# Patient Record
Sex: Female | Born: 1965
Health system: Southern US, Community
[De-identification: ages and names within clinical notes are randomized; demographics above are authoritative.]

## PROBLEM LIST (undated history)

## (undated) DIAGNOSIS — E119 Type 2 diabetes mellitus without complications: Secondary | ICD-10-CM

---

## 2012-09-20 HISTORY — PX: ESSURE TUBAL LIGATION: SUR464

## 2015-07-26 ENCOUNTER — Other Ambulatory Visit: Payer: Self-pay

## 2015-07-26 DIAGNOSIS — Z1231 Encounter for screening mammogram for malignant neoplasm of breast: Secondary | ICD-10-CM

## 2016-11-26 DIAGNOSIS — Z136 Encounter for screening for cardiovascular disorders: Secondary | ICD-10-CM | POA: Diagnosis not present

## 2016-11-26 DIAGNOSIS — Z0000 Encounter for general adult medical examination without abnormal findings: Secondary | ICD-10-CM | POA: Diagnosis not present

## 2016-11-26 DIAGNOSIS — Z01118 Encounter for examination of ears and hearing with other abnormal findings: Secondary | ICD-10-CM | POA: Diagnosis not present

## 2016-11-26 DIAGNOSIS — Z113 Encounter for screening for infections with a predominantly sexual mode of transmission: Secondary | ICD-10-CM | POA: Diagnosis not present

## 2016-11-26 DIAGNOSIS — Z131 Encounter for screening for diabetes mellitus: Secondary | ICD-10-CM | POA: Diagnosis not present

## 2017-03-02 ENCOUNTER — Ambulatory Visit
Admission: RE | Admit: 2017-03-02 | Discharge: 2017-03-02 | Disposition: A | Discharge status: Home / Self Care | Payer: 59 | Source: Ambulatory Visit | Attending: Physician Assistant | Admitting: Physician Assistant

## 2017-03-02 ENCOUNTER — Other Ambulatory Visit: Payer: Self-pay | Admitting: Physician Assistant

## 2017-03-02 DIAGNOSIS — R102 Pelvic and perineal pain: Secondary | ICD-10-CM

## 2017-03-02 DIAGNOSIS — E785 Hyperlipidemia, unspecified: Secondary | ICD-10-CM | POA: Diagnosis not present

## 2017-03-02 DIAGNOSIS — R7303 Prediabetes: Secondary | ICD-10-CM | POA: Diagnosis not present

## 2017-03-02 DIAGNOSIS — Z113 Encounter for screening for infections with a predominantly sexual mode of transmission: Secondary | ICD-10-CM | POA: Diagnosis not present

## 2017-03-02 DIAGNOSIS — Z124 Encounter for screening for malignant neoplasm of cervix: Secondary | ICD-10-CM | POA: Diagnosis not present

## 2017-03-02 DIAGNOSIS — E663 Overweight: Secondary | ICD-10-CM | POA: Diagnosis not present

## 2017-03-02 DIAGNOSIS — E559 Vitamin D deficiency, unspecified: Secondary | ICD-10-CM | POA: Diagnosis not present

## 2017-03-02 DIAGNOSIS — N760 Acute vaginitis: Secondary | ICD-10-CM | POA: Diagnosis not present

## 2017-03-03 ENCOUNTER — Other Ambulatory Visit: Payer: Self-pay | Admitting: Physician Assistant

## 2017-03-03 DIAGNOSIS — R102 Pelvic and perineal pain: Secondary | ICD-10-CM

## 2017-03-16 DIAGNOSIS — R102 Pelvic and perineal pain: Secondary | ICD-10-CM | POA: Diagnosis not present

## 2017-03-16 DIAGNOSIS — N760 Acute vaginitis: Secondary | ICD-10-CM | POA: Diagnosis not present

## 2017-03-16 DIAGNOSIS — E559 Vitamin D deficiency, unspecified: Secondary | ICD-10-CM | POA: Diagnosis not present

## 2017-03-16 DIAGNOSIS — E785 Hyperlipidemia, unspecified: Secondary | ICD-10-CM | POA: Diagnosis not present

## 2017-03-16 DIAGNOSIS — B009 Herpesviral infection, unspecified: Secondary | ICD-10-CM | POA: Diagnosis not present

## 2017-03-16 DIAGNOSIS — R7303 Prediabetes: Secondary | ICD-10-CM | POA: Diagnosis not present

## 2017-03-16 DIAGNOSIS — E663 Overweight: Secondary | ICD-10-CM | POA: Diagnosis not present

## 2017-03-28 ENCOUNTER — Other Ambulatory Visit: Payer: Self-pay | Admitting: Internal Medicine

## 2017-03-28 DIAGNOSIS — Z1231 Encounter for screening mammogram for malignant neoplasm of breast: Secondary | ICD-10-CM

## 2017-03-30 ENCOUNTER — Ambulatory Visit
Admission: RE | Admit: 2017-03-30 | Discharge: 2017-03-30 | Disposition: A | Discharge status: Home / Self Care | Payer: 59 | Source: Ambulatory Visit | Attending: Internal Medicine | Admitting: Internal Medicine

## 2017-03-30 ENCOUNTER — Encounter: Payer: Self-pay | Admitting: Radiology

## 2017-03-30 DIAGNOSIS — Z1231 Encounter for screening mammogram for malignant neoplasm of breast: Secondary | ICD-10-CM

## 2017-04-12 ENCOUNTER — Encounter (HOSPITAL_COMMUNITY): Payer: Self-pay | Admitting: Emergency Medicine

## 2017-04-12 ENCOUNTER — Emergency Department (HOSPITAL_COMMUNITY)
Admission: EM | Admit: 2017-04-12 | Discharge: 2017-04-12 | Disposition: A | Discharge status: Home / Self Care | Payer: PRIVATE HEALTH INSURANCE | Attending: Emergency Medicine | Admitting: Emergency Medicine

## 2017-04-12 ENCOUNTER — Emergency Department (HOSPITAL_COMMUNITY): Payer: PRIVATE HEALTH INSURANCE

## 2017-04-12 DIAGNOSIS — Y9289 Other specified places as the place of occurrence of the external cause: Secondary | ICD-10-CM | POA: Diagnosis not present

## 2017-04-12 DIAGNOSIS — S6992XA Unspecified injury of left wrist, hand and finger(s), initial encounter: Secondary | ICD-10-CM | POA: Insufficient documentation

## 2017-04-12 DIAGNOSIS — Y990 Civilian activity done for income or pay: Secondary | ICD-10-CM | POA: Diagnosis not present

## 2017-04-12 DIAGNOSIS — Y9389 Activity, other specified: Secondary | ICD-10-CM | POA: Insufficient documentation

## 2017-04-12 DIAGNOSIS — W230XXA Caught, crushed, jammed, or pinched between moving objects, initial encounter: Secondary | ICD-10-CM | POA: Insufficient documentation

## 2017-04-12 NOTE — ED Provider Notes (Signed)
[place] DEPT Provider Note   CSN: [id number] Arrival date & time: 04/12/17  1419     History   Chief Complaint Chief Complaint  Patient presents with  . Finger Injury    HPI Brianna Lowery is a 51 y.o. female who presents to emergency department after slamming her left hand into a cabinet while at work. 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 She complains of pain mostly of the left ring finger, stating is more painful when she moves the affected finger. She describes the pain as a throbbing sensation, rated 7/10. She has not taken anything for this and is icing the affected area currently. She stats she has mild relief of the pain with elevation of the hand. She denies weakness, numbness, tingling, swelling. She has removed her ring finger from the affected hand she says incase swelling is to occur.   HPI  History reviewed. No pertinent past medical history.  There are no active problems to display for this patient.   History reviewed. No pertinent surgical history.  OB History    No data available       Home Medications    Prior to Admission medications   Not on File    Family History No family history on file.  Social History Social History  Substance Use Topics  . Smoking status: Never Smoker  . Smokeless tobacco: Never Used  . Alcohol use No     Allergies   Patient has no known allergies.   Review of Systems Review of Systems  Musculoskeletal: Positive for arthralgias.  Skin: Negative for wound.  Neurological: Negative for weakness and numbness.     Physical Exam Updated Vital Signs BP (!) 144/94 (BP Location: Left Arm)   Pulse 94   Temp 99 F (37.2 C) (Oral)   Resp 16   Ht 5\' 2"  (1.575 m)   Wt 70.8 kg (156 lb)   LMP 03/21/2017   SpO2 96%   BMI 28.53 kg/m   Physical Exam  Constitutional: She appears well-developed and well-nourished.  HENT:  Head: Normocephalic and atraumatic.  Right Ear:  External ear normal.  Left Ear: External ear normal.  Eyes: Conjunctivae are normal. Right eye exhibits no discharge. Left eye exhibits no discharge. No scleral icterus.  Pulmonary/Chest: Effort normal. No respiratory distress.  Musculoskeletal:  Left hand: No gross deformities, skin intact. Fingers appear normal however there is tenderness to palpation over the ring finger. No TTP over flexor sheath. Radial/Ulnar arteries 2+. <2sec cap refill (ring finger appears to have more redness under nail bed, no bleeding). SILT in M/U/R distributions. Dynamic 2-pt discrimination intact over median and ulnar nerve distributions on the ulnar/radial aspects of digits. Grip 5/5 strength. MCP flexion/extension intact. Finger adduction/abduction intact with 5/5 strength. FDS/FDP intact. Thumb opposition intact. Full ROM to Flexion/Extension at wrist, MCP, PIP and DIP.    Neurological: She is alert.  Skin: No pallor.  Psychiatric: She has a normal mood and affect.  Nursing note and vitals reviewed.    ED Treatments / Results  Labs (all labs ordered are listed, but only abnormal results are displayed) Labs Reviewed - No data to display  EKG  EKG Interpretation None       Radiology Dg Hand Complete Left  Result Date: 04/12/2017 CLINICAL DATA:  Left hand was slammed in a door today with pain and swelling over the left ring finger since. EXAM: LEFT HAND - COMPLETE 3+ VIEW COMPARISON:  None in  PACs FINDINGS: The bones are subjectively adequately mineralized. No acute fracture nor dislocation is observed. Specific attention to the fourth finger reveals no acute abnormality. IMPRESSION: There is no acute fracture or dislocation of the bones of the left hand. Electronically Signed   By: David  Martinique M.D.   On: 04/12/2017 15:20    Procedures Procedures (including critical care time)  Medications Ordered in ED Medications - No data to display   Initial Impression / Assessment and Plan / ED Course  I  have reviewed the triage vital signs and the nursing notes.  Pertinent labs & imaging results that were available during my care of the patient were reviewed by me and considered in my medical decision making (see chart for details).     51 year old female presents from work after slamming her left ring finger into a cabinet. On exam has tenderness over the left ring finger. Exam otherwise unremarkable (as described above). No injury to the wrist. She is neurovascular intact. Pain medication offered prior to x-ray but she denies this time. X-rays without fracture or abnormality. The patient is prescribed conservative treatment and rice therapy. I advised the patient to follow-up with their primary care provider this week. I advised the patient to return to the emergency department with new or worsening symptoms or new concerns. Specific return precautions discussed. The patient verbalized understanding and agreement with plan. All questions answered. No further questions at this time. Patient appears safe for discharge.   Final Clinical Impressions(s) / ED Diagnoses   Final diagnoses:  Injury of finger of left hand, initial encounter    New Prescriptions There are no discharge medications for this patient.    Lorelle Gibbs 04/12/17 2126    Lajean Saver, MD 04/13/17 [phone]

## 2017-04-12 NOTE — ED Notes (Addendum)
Pt here from [facility] where she works following an accident where she slammed her left ring finger in a cabinet. Pt is able to move the affected finger. Pt has adequate cap refill. Jewelry is removed

## 2017-04-12 NOTE — Discharge Instructions (Signed)
Your xrays were negative for fracture. Please follow the below instructions and follow-up with her primary care provider for persistent symptoms in the next week. Please see below for specific return precautions. If he develop any new or worsening concerning symptoms he may return to the emergency department at any time.  Managing pain, stiffness, and swelling If directed, put ice on the injured area. Put ice in a plastic bag. Place a towel between your skin and the bag. Leave the ice on for 20 minutes, 2-3 times a day. Raise (elevate) the injured area above the level of your heart while you are sitting or lying down. For pain control you may take:  800mg  of ibuprofen (that is usually 4 over the counter pills)  3 times a day (take with food) and acetaminophen 975mg  (this is 3 over the counter pills) four times a day. Do not drink alcohol or combine with other medications that have acetaminophen as an ingredient (Read the labels!).    Contact a doctor if: You have more redness, swelling, or pain in your hand. You have fluid or blood coming from your hand. Your hand feels warm to the touch. You have pus or a bad smell coming from your hand. You have a fever. Get help right away if: You suddenly have very bad pain in your hand. You had feeling (sensation) in your hand before but you suddenly lose feeling. Your wrist or hand becomes bent (contracted) without you trying to bend it. Your symptoms had gotten better and they suddenly get worse. Your hand or fingers are turning pink or blue.

## 2017-09-15 ENCOUNTER — Inpatient Hospital Stay (HOSPITAL_COMMUNITY)
Admission: AD | Admit: 2017-09-15 | Discharge: 2017-09-15 | Discharge status: Against Medical Advice | Payer: 59 | Source: Ambulatory Visit | Attending: Obstetrics & Gynecology | Admitting: Obstetrics & Gynecology

## 2017-09-15 ENCOUNTER — Encounter (HOSPITAL_COMMUNITY): Payer: Self-pay | Admitting: *Deleted

## 2017-09-15 DIAGNOSIS — Z5321 Procedure and treatment not carried out due to patient leaving prior to being seen by health care provider: Secondary | ICD-10-CM | POA: Insufficient documentation

## 2017-09-15 LAB — POCT PREGNANCY, URINE: Preg Test, Ur: NEGATIVE

## 2017-09-15 LAB — URINALYSIS, ROUTINE W REFLEX MICROSCOPIC
Bilirubin Urine: NEGATIVE
Glucose, UA: NEGATIVE mg/dL
Hgb urine dipstick: NEGATIVE
Ketones, ur: NEGATIVE mg/dL
Leukocytes, UA: NEGATIVE
Nitrite: NEGATIVE
Protein, ur: NEGATIVE mg/dL
Specific Gravity, Urine: 1.011 (ref 1.005–1.030)
pH: 6 (ref 5.0–8.0)

## 2017-09-15 NOTE — MAU Note (Signed)
Not in lobby

## 2017-09-15 NOTE — MAU Note (Signed)
Pt reports lower abd pain for one month, worsening. Has not been seen or taken any OTC's.

## 2017-10-12 ENCOUNTER — Encounter: Payer: Self-pay | Admitting: Osteopathic Medicine

## 2017-10-12 ENCOUNTER — Ambulatory Visit (INDEPENDENT_AMBULATORY_CARE_PROVIDER_SITE_OTHER): Payer: 59 | Admitting: Osteopathic Medicine

## 2017-10-12 VITALS — BP 113/77 | HR 67 | Temp 98.6°F | Ht 62.0 in | Wt 160.1 lb

## 2017-10-12 DIAGNOSIS — Z8632 Personal history of gestational diabetes: Secondary | ICD-10-CM | POA: Insufficient documentation

## 2017-10-12 NOTE — Progress Notes (Signed)
HPI: Brianna Lowery is a 52 y.o. female who  has no past medical history on file.  she presents to [facility] today, 10/12/17,  for chief complaint of: Establish care. She states she is not yet due for annual physical.  Very pleasant patient here to establish care. I see her husband. No real questions or medical issues to address today.  G2P2 - GDM with both pregnancies   States labs about sometime last summer were all normal. No records currently available.     Past medical, surgical, social and family history reviewed:  There are no active problems to display for this patient.  Past Surgical History:  Procedure Laterality Date  . CESAREAN SECTION    . ESSURE TUBAL LIGATION  2014    Social History   Tobacco Use  . Smoking status: Never Smoker  . Smokeless tobacco: Never Used  Substance Use Topics  . Alcohol use: No    History reviewed. No pertinent family history.   Current medication list and allergy/intolerance information reviewed:    No current outpatient medications on file.   No current facility-administered medications for this visit.     No Known Allergies    Review of Systems:  Constitutional:  No  fever, no chills, No recent illness, No unintentional weight changes. No significant fatigue.   HEENT: No  headache, no vision change, no hearing change, No sore throat, No  sinus pressure  Cardiac: No  chest pain, No  pressure, No palpitations, No  Orthopnea  Respiratory:  No  shortness of breath. No  Cough  Gastrointestinal: No  abdominal pain, No  nausea, No  vomiting,  No  blood in stool, No  diarrhea, No  constipation   Musculoskeletal: No new myalgia/arthralgia  Genitourinary: No  incontinence, No  abnormal genital bleeding, No abnormal genital discharge  Skin: No  Rash, No other wounds/concerning lesions  Hem/Onc: No  easy bruising/bleeding, No  abnormal lymph node  Endocrine: No cold intolerance,   No heat intolerance. No polyuria/polydipsia/polyphagia   Neurologic: No  weakness, No  dizziness, No  slurred speech/focal weakness/facial droop  Psychiatric: No  concerns with depression, No  concerns with anxiety, No sleep problems, No mood problems  Exam:  BP 113/77   Pulse 67   Temp 98.6 F (37 C) (Oral)   Ht 5\' 2"  (1.575 m)   Wt 160 lb 1.9 oz (72.6 kg)   LMP 10/05/2017   BMI 29.29 kg/m   Constitutional: VS see above. General Appearance: alert, well-developed, well-nourished, NAD  Eyes: Normal lids and conjunctive, non-icteric sclera  Ears, Nose, Mouth, Throat: MMM, Normal external inspection ears/nares/mouth/lips/gums.   Neck: No masses, trachea midline. No thyroid enlargement. No tenderness/mass appreciated. No lymphadenopathy   Respiratory: Normal respiratory effort. no wheeze, no rhonchi, no rales  Cardiovascular: S1/S2 normal, no murmur, no rub/gallop auscultated. RRR. No lower extremity edema.   Gastrointestinal: Nontender, no masses. No hepatomegaly, no splenomegaly. No hernia appreciated. Bowel sounds normal. Rectal exam deferred.   Musculoskeletal: Gait normal. No clubbing/cyanosis of digits.   Neurological: Normal balance/coordination. No tremor.   Skin: warm, dry, intact. No rash/ulcer.    Psychiatric: Normal judgment/insight. Normal mood and affect. Oriented x3.    ASSESSMENT/PLAN:   History of gestational diabetes      Visit summary with medication list and pertinent instructions was printed for patient to review. All questions at time of visit were answered - patient instructed to contact office with any additional concerns. ER/RTC  precautions were reviewed with the patient.   Follow-up plan: Return for annual checkup when due .   Please note: voice recognition software was used to produce this document, and typos may escape review. Please contact Dr. Sheppard Coil for any needed clarifications.

## 2017-11-22 ENCOUNTER — Encounter: Payer: Self-pay | Admitting: Osteopathic Medicine

## 2017-11-22 DIAGNOSIS — R760 Raised antibody titer: Secondary | ICD-10-CM | POA: Insufficient documentation

## 2017-11-22 DIAGNOSIS — R7303 Prediabetes: Secondary | ICD-10-CM | POA: Insufficient documentation

## 2017-11-22 DIAGNOSIS — H9192 Unspecified hearing loss, left ear: Secondary | ICD-10-CM | POA: Insufficient documentation

## 2017-12-02 IMAGING — CR DG HAND COMPLETE 3+V*L*
3 series · 3 of 3 positions shown · non-contrast
Comparison: None in PACs

CLINICAL DATA: Left hand was slammed in a door today with pain and
swelling over the left ring finger since.

EXAM:
LEFT HAND - COMPLETE 3+ VIEW

[x hand pa left]
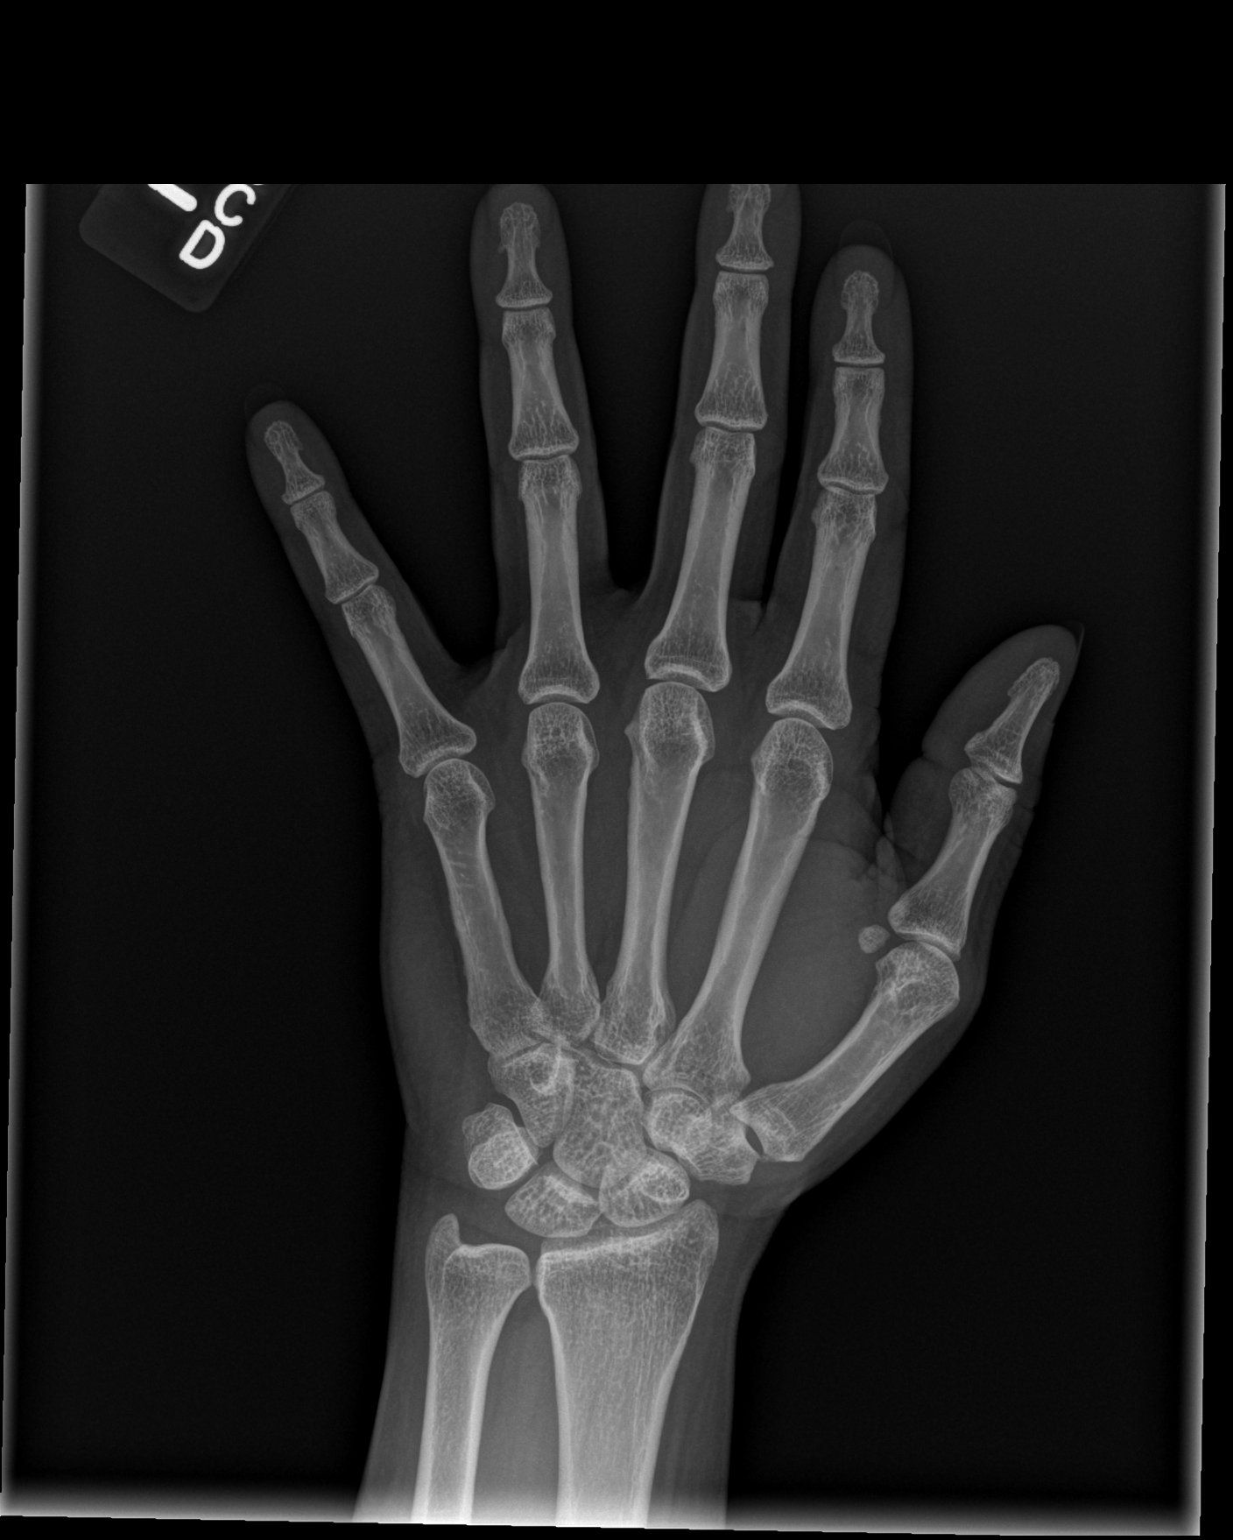

[x hand obl left]
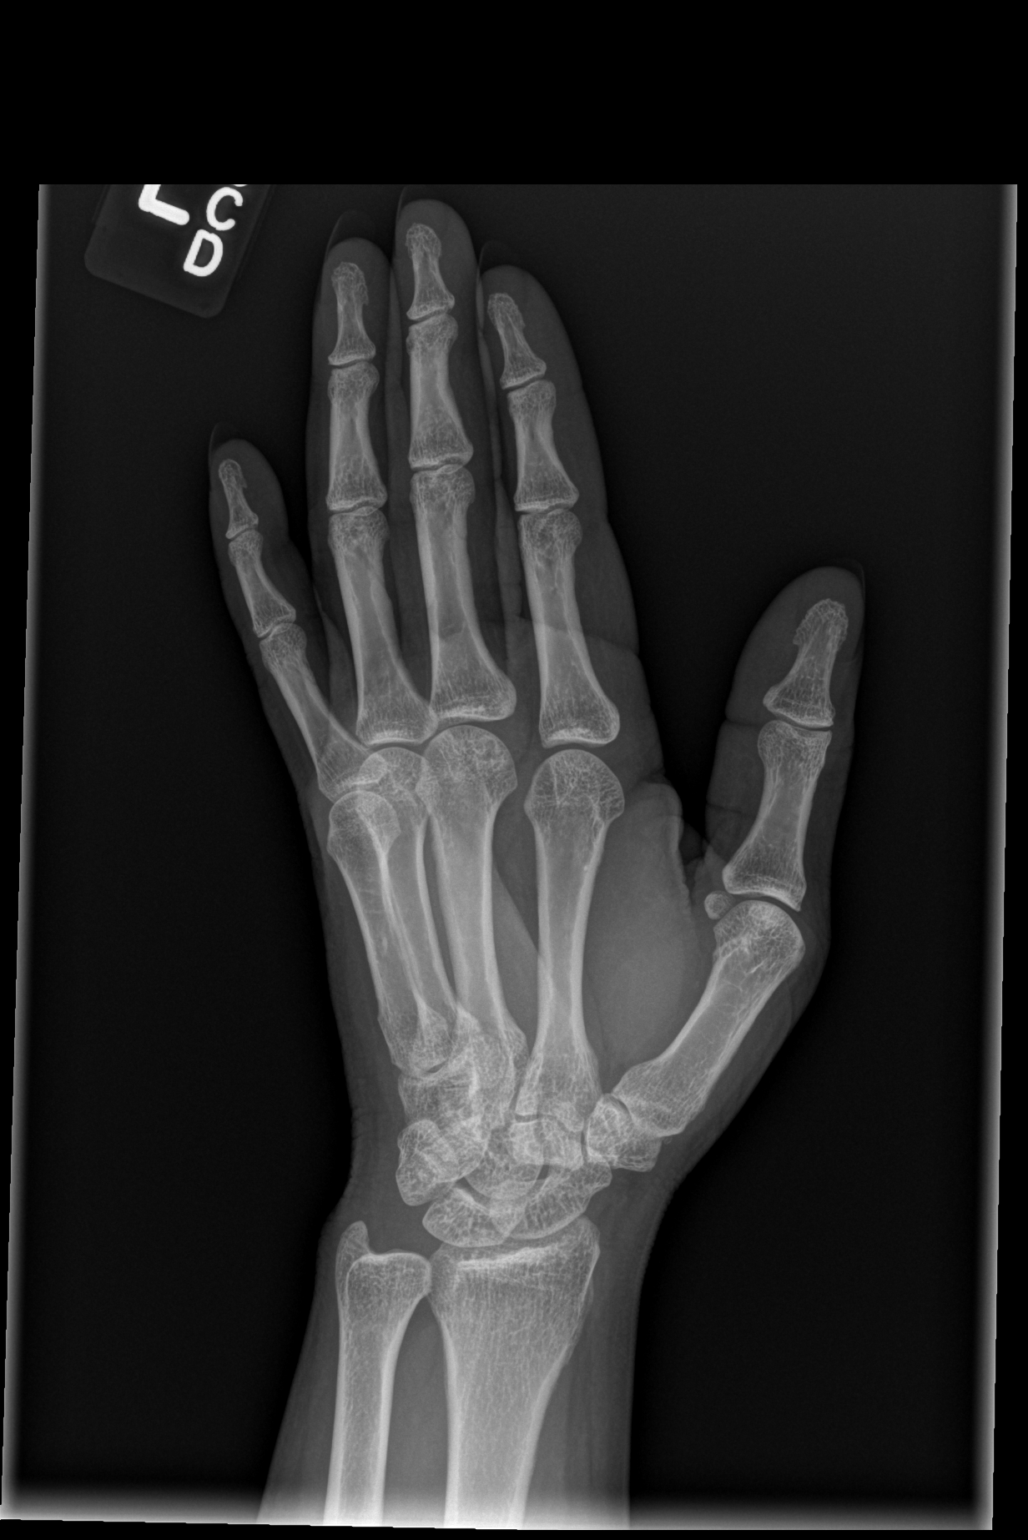

[x hand lat left]
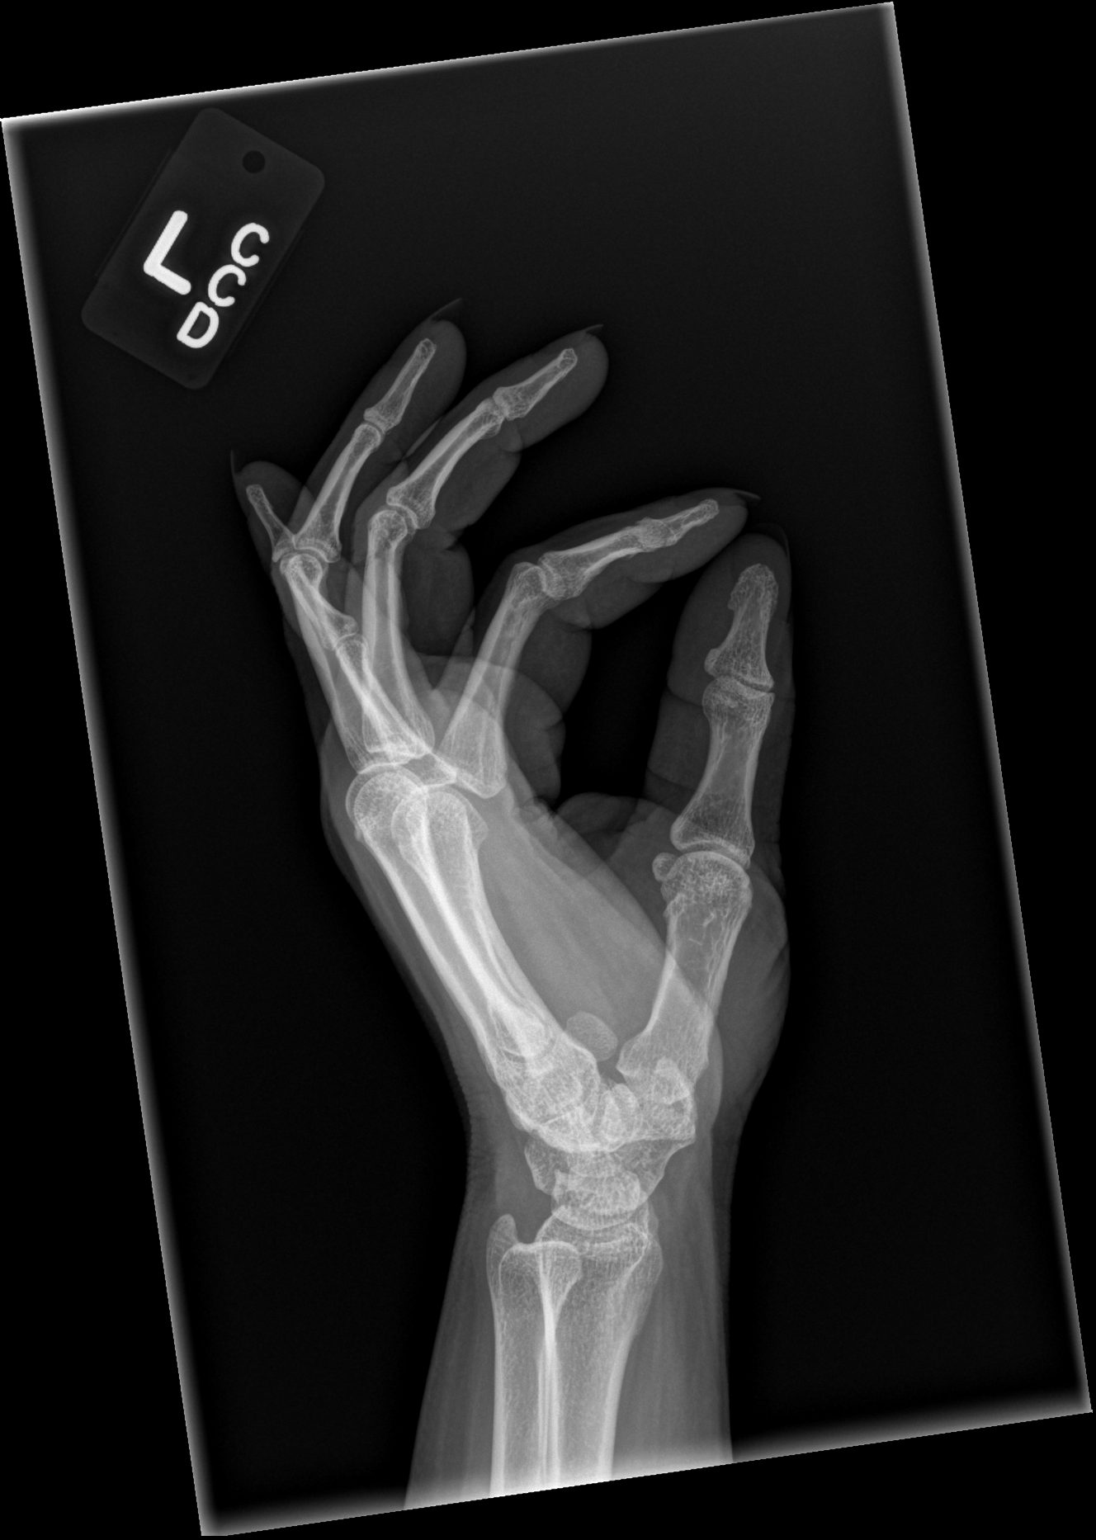

[3 of 3 positions shown; findings below may reference images not displayed]

FINDINGS: The bones are subjectively adequately mineralized. No acute fracture
nor dislocation is observed. Specific attention to the fourth finger
reveals no acute abnormality.
IMPRESSION: There is no acute fracture or dislocation of the bones of the left
hand.

## 2019-07-18 ENCOUNTER — Other Ambulatory Visit: Payer: Self-pay

## 2019-07-18 ENCOUNTER — Encounter: Payer: Self-pay | Admitting: Osteopathic Medicine

## 2019-07-18 ENCOUNTER — Ambulatory Visit (INDEPENDENT_AMBULATORY_CARE_PROVIDER_SITE_OTHER): Payer: 59 | Admitting: Osteopathic Medicine

## 2019-07-18 VITALS — BP 117/76 | HR 63 | Temp 98.1°F | Wt 162.1 lb

## 2019-07-18 DIAGNOSIS — Z0000 Encounter for general adult medical examination without abnormal findings: Secondary | ICD-10-CM

## 2019-07-18 DIAGNOSIS — Z8632 Personal history of gestational diabetes: Secondary | ICD-10-CM

## 2019-07-18 DIAGNOSIS — D72819 Decreased white blood cell count, unspecified: Secondary | ICD-10-CM | POA: Diagnosis not present

## 2019-07-18 DIAGNOSIS — E049 Nontoxic goiter, unspecified: Secondary | ICD-10-CM

## 2019-07-18 NOTE — Progress Notes (Signed)
HPI: Brianna Lowery is a 53 y.o. female who  has no past medical history on file.  she presents to [facility] today, 07/18/19,  for chief complaint of: Annual Physical     Patient here for annual physical / wellness exam.  See preventive care reviewed as below.   Additional concerns today include:  Knee pain on occasion, minor      Past medical, surgical, social and family history reviewed:  Patient Active Problem List   Diagnosis Date Noted  . 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 Prediabetes 11/22/2017  . Deafness in left ear 11/22/2017  . History of gestational diabetes 10/12/2017    Past Surgical History:  Procedure Laterality Date  . CESAREAN SECTION    . ESSURE TUBAL LIGATION  2014    Social History   Tobacco Use  . Smoking status: Never Smoker  . Smokeless tobacco: Never Used  Substance Use Topics  . Alcohol use: No    No family history on file.   Current medication list and allergy/intolerance information reviewed:    No current outpatient medications on file.   No current facility-administered medications for this visit.     No Known Allergies    Review of Systems:  Constitutional:  No  fever, no chills, No recent illness, No unintentional weight changes. No significant fatigue.   HEENT: No  headache, no vision change, no hearing change, No sore throat, No  sinus pressure  Cardiac: No  chest pain, No  pressure, No palpitations, No  Orthopnea  Respiratory:  No  shortness of breath. No  Cough  Gastrointestinal: No  abdominal pain, No  nausea, No  vomiting,  No  blood in stool, No  diarrhea, No  constipation   Musculoskeletal: No new myalgia/arthralgia  Skin: No  Rash, No other wounds/concerning lesions  Genitourinary: No  incontinence, No  abnormal genital bleeding, No abnormal genital discharge  Hem/Onc: No  easy bruising/bleeding, No  abnormal lymph node  Endocrine: No  cold intolerance,  No heat intolerance. No polyuria/polydipsia/polyphagia   Neurologic: No  weakness, No  dizziness, No  slurred speech/focal weakness/facial droop  Psychiatric: No  concerns with depression, No  concerns with anxiety, No sleep problems, No mood problems  Exam:  BP 117/76 (BP Location: Left Arm, Patient Position: Sitting, Cuff Size: Normal)   Pulse 63   Temp 98.1 F (36.7 C) (Oral)   Wt 162 lb 1.9 oz (73.5 kg)   BMI 29.65 kg/m   Constitutional: VS see above. General Appearance: alert, well-developed, well-nourished, NAD  Eyes: Normal lids and conjunctive, non-icteric sclera  Neck: No masses, trachea midline. +thyroid enlargement. No tenderness/mass appreciated. No lymphadenopathy  Respiratory: Normal respiratory effort. no wheeze, no rhonchi, no rales  Cardiovascular: S1/S2 normal, no murmur, no rub/gallop auscultated. RRR. No lower extremity edema. Pedal pulse II/IV bilaterally DP and PT. No carotid bruit or JVD. No abdominal aortic bruit.  Gastrointestinal: Nontender, no masses. No hepatomegaly, no splenomegaly. No hernia appreciated. Bowel sounds normal. Rectal exam deferred.   Musculoskeletal: Gait normal. No clubbing/cyanosis of digits.   Neurological: Normal balance/coordination. No tremor. No cranial nerve deficit on limited exam. Motor and sensation intact and symmetric. Cerebellar reflexes intact.   Skin: warm, dry, intact. No rash/ulcer. No concerning nevi or subq nodules on limited exam.    Psychiatric: Normal judgment/insight. Normal mood and affect. Oriented x3.    No results found for this or any previous visit (from the past  72 hour(s)).  No results found.   ASSESSMENT/PLAN: The primary encounter diagnosis was Annual physical exam. Diagnoses of History of gestational diabetes and Thyroid enlarged were also pertinent to this visit.   Preventive care measures declined: Tetanus booster, shingles vaccine, colon cancer screening (discussed  colonoscopy versus Cologuard), cervical cancer screening with Pap  Advise quadricep strengthening exercises for knee, follow-up with sports medicine as needed  Orders Placed This Encounter  Procedures  . MM 3D SCREEN BREAST BILATERAL  . CBC  . COMPLETE METABOLIC PANEL WITH GFR  . Lipid panel  . Hemoglobin A1c  . TSH    No orders of the defined types were placed in this encounter.   Patient Instructions  General Preventive Care  Most recent routine screening lipids/other labs: ordered today!   Everyone should have blood pressure checked once per year. Yours looks good!   Tobacco: don't!   Alcohol: responsible moderation is ok for most adults - if you have concerns about your alcohol intake, please talk to me!   Exercise: as tolerated to reduce risk of cardiovascular disease and diabetes. Strength training will also prevent osteoporosis.   Mental health: if need for mental health care (medicines, counseling, other), or concerns about moods, please let me know!   Sexual health: if need for STD testing, or if concerns with libido/pain problems, please let me know!  Advanced Directive: Living Will and/or Healthcare Power of Attorney recommended for all adults, regardless of age or health.  Vaccines  Flu vaccine: recommended for almost everyone, every fall.   Shingles vaccine: Shingrix recommended after age 53.   Pneumonia vaccines: Prevnar and Pneumovax recommended after age 75, or sooner if certain medical conditions.  Tetanus booster: Tdap recommended every 10 years.  Cancer screenings   Colon cancer screening: recommended for everyone age 31-75  Breast cancer screening: mammogram recommended annually after age 47.   Cervical cancer screening: Pap every 1 to 5 years depending on age and other risk factors.   Lung cancer screening: not needed for nonsmokers Infection screenings . HIV: recommended screening at least once age 81-65, more often as  needed. . Gonorrhea/Chlamydia: screening as needed . Hepatitis C: recommended for anyone born 1945-1965, not needed  . TB: certain at-risk populations, or depending on work requirements and/or travel history Other . Bone Density Test: recommended for women at age 93          Visit summary with medication list and pertinent instructions was printed for patient to review. All questions at time of visit were answered - patient instructed to contact office with any additional concerns or updates. ER/RTC precautions were reviewed with the patient.      Please note: voice recognition software was used to produce this document, and typos may escape review. Please contact Dr. Sheppard Coil for any needed clarifications.     Follow-up plan: Return in about 1 year (around 07/17/2020) for [place] (call week prior to visit for lab orders).

## 2019-07-18 NOTE — Patient Instructions (Addendum)
General Preventive Care  Most recent routine screening lipids/other labs: ordered today!   Everyone should have blood pressure checked once per year. Yours looks good!   Tobacco: don't!   Alcohol: responsible moderation is ok for most adults - if you have concerns about your alcohol intake, please talk to me!   Exercise: as tolerated to reduce risk of cardiovascular disease and diabetes. Strength training will also prevent osteoporosis.   Mental health: if need for mental health care (medicines, counseling, other), or concerns about moods, please let me know!   Sexual health: if need for STD testing, or if concerns with libido/pain problems, please let me know!  Advanced Directive: Living Will and/or Healthcare Power of Attorney recommended for all adults, regardless of age or health.  Vaccines  Flu vaccine: recommended for almost everyone, every fall.   Shingles vaccine: Shingrix recommended after age 38.   Pneumonia vaccines: Prevnar and Pneumovax recommended after age 27, or sooner if certain medical conditions.  Tetanus booster: Tdap recommended every 10 years.  Cancer screenings   Colon cancer screening: recommended for everyone age 75-75  Breast cancer screening: mammogram recommended annually after age 45.   Cervical cancer screening: Pap every 1 to 5 years depending on age and other risk factors.   Lung cancer screening: not needed for nonsmokers Infection screenings . HIV: recommended screening at least once age 24-65, more often as needed. . Gonorrhea/Chlamydia: screening as needed . Hepatitis C: recommended for anyone born 1945-1965, not needed  . TB: certain at-risk populations, or depending on work requirements and/or travel history Other . Bone Density Test: recommended for women at age 72

## 2019-07-19 LAB — CBC
HCT: 40 % (ref 35.0–45.0)
Hemoglobin: 13 g/dL (ref 11.7–15.5)
MCH: 28.8 pg (ref 27.0–33.0)
MCHC: 32.5 g/dL (ref 32.0–36.0)
MCV: 88.7 fL (ref 80.0–100.0)
MPV: 12 fL (ref 7.5–12.5)
Platelets: 268 10*3/uL (ref 140–400)
RBC: 4.51 10*6/uL (ref 3.80–5.10)
RDW: 12.8 % (ref 11.0–15.0)
WBC: 3.7 10*3/uL — ABNORMAL LOW (ref 3.8–10.8)

## 2019-07-19 LAB — LIPID PANEL
Cholesterol: 177 mg/dL (ref <200)
HDL: 42 mg/dL — ABNORMAL LOW (ref >=50)
LDL Cholesterol (Calc): 107 mg/dL — ABNORMAL HIGH
Non-HDL Cholesterol (Calc): 135 mg/dL — ABNORMAL HIGH (ref <130)
Total CHOL/HDL Ratio: 4.2 (calc) (ref <5.0)
Triglycerides: 165 mg/dL — ABNORMAL HIGH (ref <150)

## 2019-07-19 LAB — COMPLETE METABOLIC PANEL WITH GFR
AG Ratio: 1.4 (calc) (ref 1.0–2.5)
ALT: 21 U/L (ref 6–29)
AST: 21 U/L (ref 10–35)
Albumin: 4.1 g/dL (ref 3.6–5.1)
Alkaline phosphatase (APISO): 69 U/L (ref 37–153)
BUN/Creatinine Ratio: 12 (calc) (ref 6–22)
BUN: 13 mg/dL (ref 7–25)
CO2: 28 mmol/L (ref 20–32)
Calcium: 9.6 mg/dL (ref 8.6–10.4)
Chloride: 104 mmol/L (ref 98–110)
Creat: 1.11 mg/dL — ABNORMAL HIGH (ref 0.50–1.05)
GFR, Est African American: 67 mL/min/{1.73_m2} (ref >=60)
GFR, Est Non African American: 58 mL/min/{1.73_m2} — ABNORMAL LOW (ref >=60)
Globulin: 3 g/dL (ref 1.9–3.7)
Glucose, Bld: 119 mg/dL — ABNORMAL HIGH (ref 65–99)
Potassium: 4.3 mmol/L (ref 3.5–5.3)
Sodium: 140 mmol/L (ref 135–146)
Total Bilirubin: 0.3 mg/dL (ref 0.2–1.2)
Total Protein: 7.1 g/dL (ref 6.1–8.1)

## 2019-07-19 LAB — HEMOGLOBIN A1C
Hgb A1c MFr Bld: 6.2 %{Hb} — ABNORMAL HIGH (ref <5.7)
Mean Plasma Glucose: 131 (calc)
eAG (mmol/L): 7.3 (calc)

## 2019-07-19 NOTE — Addendum Note (Signed)
Addended by: Maryla Morrow on: 07/19/2019 07:50 AM   Modules accepted: Orders

## 2019-08-12 DIAGNOSIS — R112 Nausea with vomiting, unspecified: Secondary | ICD-10-CM | POA: Diagnosis not present

## 2019-08-12 DIAGNOSIS — Z20828 Contact with and (suspected) exposure to other viral communicable diseases: Secondary | ICD-10-CM | POA: Diagnosis not present

## 2019-08-20 ENCOUNTER — Telehealth: Payer: Self-pay

## 2019-08-20 NOTE — Telephone Encounter (Signed)
Sending to provider as Juluis Rainier -   Pt called requesting an appt to follow up appt with provider. As per pt, she went to [facility] for severe food poisoning last week. Pt continues to feel weak, unable to keep food/liquid down and loss of appetite. Pt was not given any antibiotics at time of visit to [facility]. Pls contact pt for scheduling (if needed: can use an acute time slot). Thanks.

## 2019-08-20 NOTE — Telephone Encounter (Signed)
Noted, agree needs f/u

## 2019-08-21 NOTE — Telephone Encounter (Signed)
Appointment has been made. No further questions at this time.  

## 2019-08-22 ENCOUNTER — Ambulatory Visit (INDEPENDENT_AMBULATORY_CARE_PROVIDER_SITE_OTHER): Payer: 59

## 2019-08-22 ENCOUNTER — Other Ambulatory Visit: Payer: Self-pay

## 2019-08-22 DIAGNOSIS — Z1231 Encounter for screening mammogram for malignant neoplasm of breast: Secondary | ICD-10-CM | POA: Diagnosis not present

## 2019-08-22 DIAGNOSIS — Z0000 Encounter for general adult medical examination without abnormal findings: Secondary | ICD-10-CM | POA: Diagnosis not present

## 2019-08-23 ENCOUNTER — Encounter: Payer: Self-pay | Admitting: Osteopathic Medicine

## 2019-08-23 ENCOUNTER — Ambulatory Visit (INDEPENDENT_AMBULATORY_CARE_PROVIDER_SITE_OTHER): Payer: 59 | Admitting: Osteopathic Medicine

## 2019-08-23 VITALS — BP 114/79 | HR 66 | Temp 98.2°F | Wt 158.0 lb

## 2019-08-23 DIAGNOSIS — K30 Functional dyspepsia: Secondary | ICD-10-CM

## 2019-08-23 DIAGNOSIS — A059 Bacterial foodborne intoxication, unspecified: Secondary | ICD-10-CM | POA: Diagnosis not present

## 2019-08-23 DIAGNOSIS — R110 Nausea: Secondary | ICD-10-CM

## 2019-08-23 MED ORDER — DICYCLOMINE HCL 20 MG PO TABS: 20.0000 mg | ORAL_TABLET | Freq: Three times a day (TID) | ORAL | 0 refills | Status: DC

## 2019-08-23 MED ORDER — PANTOPRAZOLE SODIUM 40 MG PO TBEC: 40.0000 mg | DELAYED_RELEASE_TABLET | Freq: Every day | ORAL | 0 refills | Status: DC

## 2019-08-23 NOTE — Progress Notes (Signed)
HPI: Brianna Lowery is a 53 y.o. female who  has no past medical history on file.  she presents to [facility] today, 08/23/19,  for chief complaint of:  Follow up food poisoning  . Context: sushi from [employer] caused food poisoning just prior to Thanksgiving (litle over a week ago), feeling improved but some symptoms are still there  . Location/Quality: upset stomach, gurgling. Nausea, vomiting and diarrhea have resolved.  . Modifying factors: treatment from urgent care (no records available, history per patient) - no meds, neg flu and COVID testing  . Assoc signs/symptoms: loss of taste     At today's visit 08/23/19 ... PMH, PSH, FH reviewed and updated as needed.  Current medication list and allergy/intolerance hx reviewed and updated as needed. (See remainder of HPI, ROS, Phys Exam below)        ASSESSMENT/PLAN: The primary encounter diagnosis was Food poisoning. Diagnoses of Nausea and Stomach upset were also pertinent to this visit.   Meds ordered this encounter  Medications  . pantoprazole (PROTONIX) 40 MG tablet    Sig: Take 1 tablet (40 mg total) by mouth daily.    Dispense:  30 tablet    Refill:  0  . dicyclomine (BENTYL) 20 MG tablet    Sig: Take 1 tablet (20 mg total) by mouth 4 (four) times daily -  before meals and at bedtime.    Dispense:  30 tablet    Refill:  0    Patient Instructions    Can try dicyclomine aka Bentyl for intestinal spasm  Can try antacid for 2-4 weeks  See below for suggestions for food, try small frequent portions  Food poisoning will go away on its own but the symptoms can be bothersome and recovery can take time. Stay home and rest, try to eat and drink as below, and be active as you are able!      Bland Diet A bland diet consists of foods that are often soft and do not have a lot of fat, fiber, or extra seasonings. Foods without fat, fiber, or seasoning are easier for the body  to digest. They are also less likely to irritate your mouth, throat, stomach, and other parts of your digestive system. A bland diet is sometimes called a BRAT diet. What is my plan? Your health care provider or food and nutrition specialist (dietitian) may recommend specific changes to your diet to prevent symptoms or to treat your symptoms. These changes may include:  Eating small meals often.  Cooking food until it is soft enough to chew easily.  Chewing your food well.  Drinking fluids slowly.  Not eating foods that are very spicy, sour, or fatty.  Not eating citrus fruits, such as oranges and grapefruit. What do I need to know about this diet?  Eat a variety of foods from the bland diet food list.  Do not follow a bland diet longer than needed.  Ask your health care provider whether you should take vitamins or supplements. What foods can I eat? Grains  Hot cereals, such as cream of wheat. Rice. Bread, crackers, or tortillas made from refined white flour. Vegetables Canned or cooked vegetables. Mashed or boiled potatoes. Fruits  Bananas. Applesauce. Other types of cooked or canned fruit with the skin and seeds removed, such as canned peaches or pears. Meats and other proteins  Scrambled eggs. Creamy peanut butter or other nut butters. Lean, well-cooked meats, such as chicken or fish. Tofu.  Soups or broths. Dairy Low-fat dairy products, such as milk, cottage cheese, or yogurt. Beverages  Water. Herbal tea. Apple juice. Fats and oils Mild salad dressings. Canola or olive oil. Sweets and desserts Pudding. Custard. Fruit gelatin. Ice cream. The items listed above may not be a complete list of recommended foods and beverages. Contact a dietitian for more options. What foods are not recommended? Grains Whole grain breads and cereals. Vegetables Raw vegetables. Fruits Raw fruits, especially citrus, berries, or dried fruits. Dairy Whole fat dairy foods. Beverages  Caffeinated drinks. Alcohol. Seasonings and condiments Strongly flavored seasonings or condiments. Hot sauce. Salsa. Other foods Spicy foods. Fried foods. Sour foods, such as pickled or fermented foods. Foods with high sugar content. Foods high in fiber. The items listed above may not be a complete list of foods and beverages to avoid. Contact a dietitian for more information. Summary  A bland diet consists of foods that are often soft and do not have a lot of fat, fiber, or extra seasonings.  Foods without fat, fiber, or seasoning are easier for the body to digest.  Check with your health care provider to see how long you should follow this diet plan. It is not meant to be followed for long periods. This information is not intended to replace advice given to you by your health care provider. Make sure you discuss any questions you have with your health care provider. Document Released: 12/29/2015 Document Revised: 10/05/2017 Document Reviewed: 10/05/2017 Elsevier Patient Education  2020 [employer].      Avoid "FODMAPs" - foods on the list below   2017 UpToDate Characteristics and sources of common FODMAPs  Word that corresponds to letter in acronym Compounds in this category Foods that contain these compounds  F Fermentable  O Oligosaccharides Fructans, galacto-oligosaccharides Wheat, barley, rye, onion, leek, white part of spring onion, garlic, shallots, artichokes, beetroot, fennel, peas, chicory, pistachio, cashews, legumes, lentils, and chickpeas  D Disaccharides Lactose Milk, custard, ice cream, and yogurt  M Monosaccharides "Free fructose" (fructose in excess of glucose) Apples, pears, mangoes, cherries, watermelon, asparagus, sugar snap peas, honey, high-fructose corn syrup  A And  P Polyols Sorbitol, mannitol, maltitol, and xylitol Apples, pears, apricots, cherries, nectarines, peaches, plums, watermelon, mushrooms, cauliflower, artificially sweetened chewing gum and  confectionery  FODMAPs: fermentable oligosaccharides, disaccharides, monosaccharides, and polyols. Adapted by permission from [employer]: [employer]. Agustin Cree, [employer], [place]. Short-chain carbohydrates and functional gastrointestinal disorders. Am J Gastroenterol 2013; 108:707. Copyright  2013. www.nature.com/ajg. Graphic [phone] Version 2.0         Follow-up plan: Return if symptoms worsen or fail to improve.                                                 ################################################# ################################################# ################################################# #################################################    No outpatient medications have been marked as taking for the 08/23/19 encounter (Office Visit) with Emeterio Reeve, DO.    No Known Allergies     Review of Systems:  Constitutional: +recent illness as noted HPI, no fever, no chills, +fatigue  HEENT: No  headache, no vision change  Cardiac: No  chest pain, No  pressure, No palpitations  Respiratory:  No  shortness of breath. No  Cough  Gastrointestinal: +abdominal gurgling but no severe pain, no diarrhea, no nausea  Musculoskeletal: No new myalgia/arthralgia  Skin: No  Rash  Neurologic: +generalized weakness, No  Dizziness  Psychiatric: No  concerns with depression, No  concerns with anxiety  Exam:  BP 114/79 (BP Location: Right Arm, Patient Position: Sitting, Cuff Size: Normal)   Pulse 66   Temp 98.2 F (36.8 C) (Oral)   Wt 158 lb 0.6 oz (71.7 kg)   BMI 28.91 kg/m   Constitutional: VS see above. General Appearance: alert, well-developed, well-nourished, NAD  Eyes: Normal lids and conjunctive, non-icteric sclera.  Neck: No masses, trachea midline.   Musculoskeletal: Gait normal. Symmetric and independent movement of all extremities  Abdominal: non-tender,  non-distended, no appreciable organomegaly, neg Murphy's, BS WNLx4  Neurological: Normal balance/coordination. No tremor.  Skin: warm, dry, intact.   Psychiatric: Normal judgment/insight. Normal mood and affect. Oriented x3.       Visit summary with medication list and pertinent instructions was printed for patient to review, patient was advised to alert [place] if any updates are needed. All questions at time of visit were answered - patient instructed to contact office with any additional concerns. ER/RTC precautions were reviewed with the patient and understanding verbalized.    Please note: voice recognition software was used to produce this document, and typos may escape review. Please contact Dr. Sheppard Coil for any needed clarifications.    Follow up plan: Return if symptoms worsen or fail to improve.

## 2019-08-23 NOTE — Patient Instructions (Signed)
Can try dicyclomine aka Bentyl for intestinal spasm  Can try antacid for 2-4 weeks  See below for suggestions for food, try small frequent portions  Food poisoning will go away on its own but the symptoms can be bothersome and recovery can take time. Stay home and rest, try to eat and drink as below, and be active as you are able!      Bland Diet A bland diet consists of foods that are often soft and do not have a lot of fat, fiber, or extra seasonings. Foods without fat, fiber, or seasoning are easier for the body to digest. They are also less likely to irritate your mouth, throat, stomach, and other parts of your digestive system. A bland diet is sometimes called a BRAT diet. What is my plan? Your health care provider or food and nutrition specialist (dietitian) may recommend specific changes to your diet to prevent symptoms or to treat your symptoms. These changes may include:  Eating small meals often.  Cooking food until it is soft enough to chew easily.  Chewing your food well.  Drinking fluids slowly.  Not eating foods that are very spicy, sour, or fatty.  Not eating citrus fruits, such as oranges and grapefruit. What do I need to know about this diet?  Eat a variety of foods from the bland diet food list.  Do not follow a bland diet longer than needed.  Ask your health care provider whether you should take vitamins or supplements. What foods can I eat? Grains  Hot cereals, such as cream of wheat. Rice. Bread, crackers, or tortillas made from refined white flour. Vegetables Canned or cooked vegetables. Mashed or boiled potatoes. Fruits  Bananas. Applesauce. Other types of cooked or canned fruit with the skin and seeds removed, such as canned peaches or pears. Meats and other proteins  Scrambled eggs. Creamy peanut butter or other nut butters. Lean, well-cooked meats, such as chicken or fish. Tofu. Soups or broths. Dairy Low-fat dairy products, such as milk,  cottage cheese, or yogurt. Beverages  Water. Herbal tea. Apple juice. Fats and oils Mild salad dressings. Canola or olive oil. Sweets and desserts Pudding. Custard. Fruit gelatin. Ice cream. The items listed above may not be a complete list of recommended foods and beverages. Contact a dietitian for more options. What foods are not recommended? Grains Whole grain breads and cereals. Vegetables Raw vegetables. Fruits Raw fruits, especially citrus, berries, or dried fruits. Dairy Whole fat dairy foods. Beverages Caffeinated drinks. Alcohol. Seasonings and condiments Strongly flavored seasonings or condiments. Hot sauce. Salsa. Other foods Spicy foods. Fried foods. Sour foods, such as pickled or fermented foods. Foods with high sugar content. Foods high in fiber. The items listed above may not be a complete list of foods and beverages to avoid. Contact a dietitian for more information. Summary  A bland diet consists of foods that are often soft and do not have a lot of fat, fiber, or extra seasonings.  Foods without fat, fiber, or seasoning are easier for the body to digest.  Check with your health care provider to see how long you should follow this diet plan. It is not meant to be followed for long periods. This information is not intended to replace advice given to you by your health care provider. Make sure you discuss any questions you have with your health care provider. Document Released: 12/29/2015 Document Revised: 10/05/2017 Document Reviewed: 10/05/2017 Elsevier Patient Education  2020 [employer].  Avoid "FODMAPs" - foods on the list below   2017 UpToDate Characteristics and sources of common FODMAPs  Word that corresponds to letter in acronym Compounds in this category Foods that contain these compounds  F Fermentable  O Oligosaccharides Fructans, galacto-oligosaccharides Wheat, barley, rye, onion, leek, white part of spring onion, garlic, shallots,  artichokes, beetroot, fennel, peas, chicory, pistachio, cashews, legumes, lentils, and chickpeas  D Disaccharides Lactose Milk, custard, ice cream, and yogurt  M Monosaccharides "Free fructose" (fructose in excess of glucose) Apples, pears, mangoes, cherries, watermelon, asparagus, sugar snap peas, honey, high-fructose corn syrup  A And  P Polyols Sorbitol, mannitol, maltitol, and xylitol Apples, pears, apricots, cherries, nectarines, peaches, plums, watermelon, mushrooms, cauliflower, artificially sweetened chewing gum and confectionery  FODMAPs: fermentable oligosaccharides, disaccharides, monosaccharides, and polyols. Adapted by permission from [employer]: [employer]. Agustin Cree, [employer], [place]. Short-chain carbohydrates and functional gastrointestinal disorders. Am J Gastroenterol 2013; 108:707. Copyright  2013. www.nature.com/ajg. Graphic [phone] Version 2.0

## 2019-09-12 ENCOUNTER — Ambulatory Visit: Payer: 59 | Admitting: Osteopathic Medicine

## 2019-11-28 ENCOUNTER — Telehealth (INDEPENDENT_AMBULATORY_CARE_PROVIDER_SITE_OTHER): Payer: 59 | Admitting: Osteopathic Medicine

## 2019-11-28 ENCOUNTER — Encounter: Payer: Self-pay | Admitting: Osteopathic Medicine

## 2019-11-28 ENCOUNTER — Other Ambulatory Visit: Payer: Self-pay

## 2019-11-28 DIAGNOSIS — M5410 Radiculopathy, site unspecified: Secondary | ICD-10-CM | POA: Diagnosis not present

## 2019-11-28 MED ORDER — GABAPENTIN 300 MG PO CAPS: 300.0000 mg | ORAL_CAPSULE | Freq: Three times a day (TID) | ORAL | 3 refills | Status: DC

## 2019-11-28 NOTE — Progress Notes (Signed)
Virtual Visit via Video (App used: MyChart) Note  I connected with      Brianna Lowery on 11/28/19 at 3:10 PM  by a telemedicine application and verified that I am speaking with the correct person using two identifiers.  Patient is at home I am in office   I discussed the limitations of evaluation and management by telemedicine and the availability of in person appointments. The patient expressed understanding and agreed to proceed.  History of Present Illness: Brianna Lowery is a 54 y.o. female who would like to discuss arm pain   Left arm x1 month From shoudler down to fingers Soreness, tingling, weakness Assoc w/ hand weakness on L   Aleve and muscle rubs help temporarily  Worse at night when lying on her side        Observations/Objective: There were no vitals taken for this visit. BP Readings from Last 3 Encounters:  08/23/19 114/79  07/18/19 117/76  10/12/17 113/77   Exam: Normal Speech.  NAD  Lab and Radiology Results No results found for this or any previous visit (from the past 72 hour(s)). No results found.     Assessment and Plan: 53 y.o. female with The encounter diagnosis was Radicular pain.  Difficult to assess w/o physical - pt is vaccinated but husband has COVID, I think it would be fine for her to be out and about with mask precautions.   No neck pain ?carpal or radial tunnel, cervical nerve impingement? Will sent PT exercises Neck and shoulder XR Sports med to assess if no improvement   PDMP not reviewed this encounter. Orders Placed This Encounter  Procedures  . DG CERVICAL SPINE 2 VIEW  . DG Shoulder Left    Order Specific Question:   Reason for Exam (SYMPTOM  OR DIAGNOSIS REQUIRED)    Answer:   shoulder/arm pain w/ radicular symptoms    Order Specific Question:   Is patient pregnant?    Answer:   No    Order Specific Question:   Preferred imaging location?    Answer:   [facility]    Order Specific  Question:   Radiology Contrast Protocol - do NOT remove file path    Answer:   [email or web address]  . Ambulatory referral to Physical Therapy    Referral Priority:   Routine    Referral Type:   Physical Medicine    Referral Reason:   Specialty Services Required    Requested Specialty:   Physical Therapy    Number of Visits Requested:   1   Meds ordered this encounter  Medications  . gabapentin (NEURONTIN) 300 MG capsule    Sig: Take 1-2 capsules (300-600 mg total) by mouth 3 (three) times daily.    Dispense:  180 capsule    Refill:  3   Patient Instructions  Cervical Strain and Sprain Rehab Ask your health care provider which exercises are safe for you. Do exercises exactly as told by your health care provider and adjust them as directed. It is normal to feel mild stretching, pulling, tightness, or discomfort as you do these exercises. Stop right away if you feel sudden pain or your pain gets worse. Do not begin these exercises until told by your health care provider. Stretching and range-of-motion exercises Cervical side bending  1. Using good posture, sit on a stable chair or stand up. 2. Without moving your shoulders, slowly tilt your left / right ear to your shoulder until you feel a stretch  in the opposite side neck muscles. You should be looking straight ahead. 3. Hold for __________ seconds. 4. Repeat with the other side of your neck. Repeat __________ times. Complete this exercise __________ times a day. Cervical rotation  1. Using good posture, sit on a stable chair or stand up. 2. Slowly turn your head to the side as if you are looking over your left / right shoulder. ? Keep your eyes level with the ground. ? Stop when you feel a stretch along the side and the back of your neck. 3. Hold for __________ seconds. 4. Repeat this by turning to your other side. Repeat __________ times. Complete this exercise __________ times a  day. Thoracic extension and pectoral stretch 1. Roll a towel or a small blanket so it is about 4 inches (10 cm) in diameter. 2. Lie down on your back on a firm surface. 3. Put the towel lengthwise, under your spine in the middle of your back. It should not be under your shoulder blades. The towel should line up with your spine from your middle back to your lower back. 4. Put your hands behind your head and let your elbows fall out to your sides. 5. Hold for __________ seconds. Repeat __________ times. Complete this exercise __________ times a day. Strengthening exercises Isometric upper cervical flexion 1. Lie on your back with a thin pillow behind your head and a small rolled-up towel under your neck. 2. Gently tuck your chin toward your chest and nod your head down to look toward your feet. Do not lift your head off the pillow. 3. Hold for __________ seconds. 4. Release the tension slowly. Relax your neck muscles completely before you repeat this exercise. Repeat __________ times. Complete this exercise __________ times a day. Isometric cervical extension  1. Stand about 6 inches (15 cm) away from a wall, with your back facing the wall. 2. Place a soft object, about 6-8 inches (15-20 cm) in diameter, between the back of your head and the wall. A soft object could be a small pillow, a ball, or a folded towel. 3. Gently tilt your head back and press into the soft object. Keep your jaw and forehead relaxed. 4. Hold for __________ seconds. 5. Release the tension slowly. Relax your neck muscles completely before you repeat this exercise. Repeat __________ times. Complete this exercise __________ times a day. Posture and body mechanics Body mechanics refers to the movements and positions of your body while you do your daily activities. Posture is part of body mechanics. Good posture and healthy body mechanics can help to relieve stress in your body's tissues and joints. Good posture means that  your spine is in its natural S-curve position (your spine is neutral), your shoulders are pulled back slightly, and your head is not tipped forward. The following are general guidelines for applying improved posture and body mechanics to your everyday activities. Sitting  1. When sitting, keep your spine neutral and keep your feet flat on the floor. Use a footrest, if necessary, and keep your thighs parallel to the floor. Avoid rounding your shoulders, and avoid tilting your head forward. 2. When working at a desk or a computer, keep your desk at a height where your hands are slightly lower than your elbows. Slide your chair under your desk so you are close enough to maintain good posture. 3. When working at a computer, place your monitor at a height where you are looking straight ahead and you do not have to  tilt your head forward or downward to look at the screen. Standing   When standing, keep your spine neutral and keep your feet about hip-width apart. Keep a slight bend in your knees. Your ears, shoulders, and hips should line up.  When you do a task in which you stand in one place for a long time, place one foot up on a stable object that is 2-4 inches (5-10 cm) high, such as a footstool. This helps keep your spine neutral. Resting When lying down and resting, avoid positions that are most painful for you. Try to support your neck in a neutral position. You can use a contour pillow or a small rolled-up towel. Your pillow should support your neck but not push on it. This information is not intended to replace advice given to you by your health care provider. Make sure you discuss any questions you have with your health care provider. Document Revised: 12/27/2018 Document Reviewed: 06/07/2018 Elsevier Patient Education  [place].    Shoulder Exercises Ask your health care provider which exercises are safe for you. Do exercises exactly as told by your health care provider and adjust  them as directed. It is normal to feel mild stretching, pulling, tightness, or discomfort as you do these exercises. Stop right away if you feel sudden pain or your pain gets worse. Do not begin these exercises until told by your health care provider. Stretching exercises External rotation and abduction This exercise is sometimes called corner stretch. This exercise rotates your arm outward (external rotation) and moves your arm out from your body (abduction). 5. Stand in a doorway with one of your feet slightly in front of the other. This is called a staggered stance. If you cannot reach your forearms to the door frame, stand facing a corner of a room. 6. Choose one of the following positions as told by your health care provider: ? Place your hands and forearms on the door frame above your head. ? Place your hands and forearms on the door frame at the height of your head. ? Place your hands on the door frame at the height of your elbows. 7. Slowly move your weight onto your front foot until you feel a stretch across your chest and in the front of your shoulders. Keep your head and chest upright and keep your abdominal muscles tight. 8. Hold for __________ seconds. 9. To release the stretch, shift your weight to your back foot. Repeat __________ times. Complete this exercise __________ times a day. Extension, standing 5. Stand and hold a broomstick, a cane, or a similar object behind your back. ? Your hands should be a little wider than shoulder width apart. ? Your palms should face away from your back. 6. Keeping your elbows straight and your shoulder muscles relaxed, move the stick away from your body until you feel a stretch in your shoulders (extension). ? Avoid shrugging your shoulders while you move the stick. Keep your shoulder blades tucked down toward the middle of your back. 7. Hold for __________ seconds. 8. Slowly return to the starting position. Repeat __________ times. Complete  this exercise __________ times a day. Range-of-motion exercises Pendulum  6. Stand near a wall or a surface that you can hold onto for balance. 7. Bend at the waist and let your left / right arm hang straight down. Use your other arm to support you. Keep your back straight and do not lock your knees. 8. Relax your left / right arm  and shoulder muscles, and move your hips and your trunk so your left / right arm swings freely. Your arm should swing because of the motion of your body, not because you are using your arm or shoulder muscles. 9. Keep moving your hips and trunk so your arm swings in the following directions, as told by your health care provider: ? Side to side. ? Forward and backward. ? In clockwise and counterclockwise circles. 10. Continue each motion for __________ seconds, or for as long as told by your health care provider. 11. Slowly return to the starting position. Repeat __________ times. Complete this exercise __________ times a day. Shoulder flexion, standing  5. Stand and hold a broomstick, a cane, or a similar object. Place your hands a little more than shoulder width apart on the object. Your left / right hand should be palm up, and your other hand should be palm down. 6. Keep your elbow straight and your shoulder muscles relaxed. Push the stick up with your healthy arm to raise your left / right arm in front of your body, and then over your head until you feel a stretch in your shoulder (flexion). ? Avoid shrugging your shoulder while you raise your arm. Keep your shoulder blade tucked down toward the middle of your back. 7. Hold for __________ seconds. 8. Slowly return to the starting position. Repeat __________ times. Complete this exercise __________ times a day. Shoulder abduction, standing 6. Stand and hold a broomstick, a cane, or a similar object. Place your hands a little more than shoulder width apart on the object. Your left / right hand should be palm up, and  your other hand should be palm down. 7. Keep your elbow straight and your shoulder muscles relaxed. Push the object across your body toward your left / right side. Raise your left / right arm to the side of your body (abduction) until you feel a stretch in your shoulder. ? Do not raise your arm above shoulder height unless your health care provider tells you to do that. ? If directed, raise your arm over your head. ? Avoid shrugging your shoulder while you raise your arm. Keep your shoulder blade tucked down toward the middle of your back. 8. Hold for __________ seconds. 9. Slowly return to the starting position. Repeat __________ times. Complete this exercise __________ times a day. Internal rotation  4. Place your left / right hand behind your back, palm up. 5. Use your other hand to dangle an exercise band, a towel, or a similar object over your shoulder. Grasp the band with your left / right hand so you are holding on to both ends. 6. Gently pull up on the band until you feel a stretch in the front of your left / right shoulder. The movement of your arm toward the center of your body is called internal rotation. ? Avoid shrugging your shoulder while you raise your arm. Keep your shoulder blade tucked down toward the middle of your back. 7. Hold for __________ seconds. 8. Release the stretch by letting go of the band and lowering your hands. Repeat __________ times. Complete this exercise __________ times a day. Strengthening exercises External rotation  1. Sit in a stable chair without armrests. 2. Secure an exercise band to a stable object at elbow height on your left / right side. 3. Place a soft object, such as a folded towel or a small pillow, between your left / right upper arm and your body to move  your elbow about 4 inches (10 cm) away from your side. 4. Hold the end of the exercise band so it is tight and there is no slack. 5. Keeping your elbow pressed against the soft object,  slowly move your forearm out, away from your abdomen (external rotation). Keep your body steady so only your forearm moves. 6. Hold for __________ seconds. 7. Slowly return to the starting position. Repeat __________ times. Complete this exercise __________ times a day. Shoulder abduction  1. Sit in a stable chair without armrests, or stand up. 2. Hold a __________ weight in your left / right hand, or hold an exercise band with both hands. 3. Start with your arms straight down and your left / right palm facing in, toward your body. 4. Slowly lift your left / right hand out to your side (abduction). Do not lift your hand above shoulder height unless your health care provider tells you that this is safe. ? Keep your arms straight. ? Avoid shrugging your shoulder while you do this movement. Keep your shoulder blade tucked down toward the middle of your back. 5. Hold for __________ seconds. 6. Slowly lower your arm, and return to the starting position. Repeat __________ times. Complete this exercise __________ times a day. Shoulder extension 1. Sit in a stable chair without armrests, or stand up. 2. Secure an exercise band to a stable object in front of you so it is at shoulder height. 3. Hold one end of the exercise band in each hand. Your palms should face each other. 4. Straighten your elbows and lift your hands up to shoulder height. 5. Step back, away from the secured end of the exercise band, until the band is tight and there is no slack. 6. Squeeze your shoulder blades together as you pull your hands down to the sides of your thighs (extension). Stop when your hands are straight down by your sides. Do not let your hands go behind your body. 7. Hold for __________ seconds. 8. Slowly return to the starting position. Repeat __________ times. Complete this exercise __________ times a day. Shoulder row 1. Sit in a stable chair without armrests, or stand up. 2. Secure an exercise band to a  stable object in front of you so it is at waist height. 3. Hold one end of the exercise band in each hand. Position your palms so that your thumbs are facing the ceiling (neutral position). 4. Bend each of your elbows to a 90-degree angle (right angle) and keep your upper arms at your sides. 5. Step back until the band is tight and there is no slack. 6. Slowly pull your elbows back behind you. 7. Hold for __________ seconds. 8. Slowly return to the starting position. Repeat __________ times. Complete this exercise __________ times a day. Shoulder press-ups  1. Sit in a stable chair that has armrests. Sit upright, with your feet flat on the floor. 2. Put your hands on the armrests so your elbows are bent and your fingers are pointing forward. Your hands should be about even with the sides of your body. 3. Push down on the armrests and use your arms to lift yourself off the chair. Straighten your elbows and lift yourself up as much as you comfortably can. ? Move your shoulder blades down, and avoid letting your shoulders move up toward your ears. ? Keep your feet on the ground. As you get stronger, your feet should support less of your body weight as you lift yourself up.  4. Hold for __________ seconds. 5. Slowly lower yourself back into the chair. Repeat __________ times. Complete this exercise __________ times a day. Wall push-ups  1. Stand so you are facing a stable wall. Your feet should be about one arm-length away from the wall. 2. Lean forward and place your palms on the wall at shoulder height. 3. Keep your feet flat on the floor as you bend your elbows and lean forward toward the wall. 4. Hold for __________ seconds. 5. Straighten your elbows to push yourself back to the starting position. Repeat __________ times. Complete this exercise __________ times a day. This information is not intended to replace advice given to you by your health care provider. Make sure you discuss any  questions you have with your health care provider. Document Revised: 12/29/2018 Document Reviewed: 10/06/2018 Elsevier Patient Education  [place].    Instructions sent via [facility]. If MyChart not available, pt was given option for info via personal e-mail w/ no guarantee of protected health info over unsecured e-mail communication, and MyChart sign-up instructions were sent to patient.   Follow Up Instructions: No follow-ups on file.    I discussed the assessment and treatment plan with the patient. The patient was provided an opportunity to ask questions and all were answered. The patient agreed with the plan and demonstrated an understanding of the instructions.   The patient was advised to call back or seek an in-person evaluation if any new concerns, if symptoms worsen or if the condition fails to improve as anticipated.  30 minutes of non-face-to-face time was provided during this encounter.      . . . . . . . . . . . . . Marland Kitchen                   Historical information moved to improve visibility of documentation.  No past medical history on file. Past Surgical History:  Procedure Laterality Date  . CESAREAN SECTION    . ESSURE TUBAL LIGATION  2014   Social History   Tobacco Use  . Smoking status: Never Smoker  . Smokeless tobacco: Never Used  Substance Use Topics  . Alcohol use: No   family history is not on file.  Medications: Current Outpatient Medications  Medication Sig Dispense Refill  . dicyclomine (BENTYL) 20 MG tablet Take 1 tablet (20 mg total) by mouth 4 (four) times daily -  before meals and at bedtime. 30 tablet 0  . pantoprazole (PROTONIX) 40 MG tablet Take 1 tablet (40 mg total) by mouth daily. 30 tablet 0  . gabapentin (NEURONTIN) 300 MG capsule Take 1-2 capsules (300-600 mg total) by mouth 3 (three) times daily. 180 capsule 3  . ondansetron (ZOFRAN-ODT) 4 MG disintegrating tablet      No current  facility-administered medications for this visit.   No Known Allergies

## 2019-11-28 NOTE — Progress Notes (Signed)
Patient's changed to virtual due to living with COVID positive patient.   Pt states left arm pain x 1 month. Pain radiates from shoulder to fingers causing weakness in the hand.

## 2019-11-29 ENCOUNTER — Encounter: Payer: Self-pay | Admitting: Osteopathic Medicine

## 2019-11-29 NOTE — Patient Instructions (Signed)
Cervical Strain and Sprain Rehab Ask your health care provider which exercises are safe for you. Do exercises exactly as told by your health care provider and adjust them as directed. It is normal to feel mild stretching, pulling, tightness, or discomfort as you do these exercises. Stop right away if you feel sudden pain or your pain gets worse. Do not begin these exercises until told by your health care provider. Stretching and range-of-motion exercises Cervical side bending  1. Using good posture, sit on a stable chair or stand up. 2. Without moving your shoulders, slowly tilt your left / right ear to your shoulder until you feel a stretch in the opposite side neck muscles. You should be looking straight ahead. 3. Hold for __________ seconds. 4. Repeat with the other side of your neck. Repeat __________ times. Complete this exercise __________ times a day. Cervical rotation  1. Using good posture, sit on a stable chair or stand up. 2. Slowly turn your head to the side as if you are looking over your left / right shoulder. ? Keep your eyes level with the ground. ? Stop when you feel a stretch along the side and the back of your neck. 3. Hold for __________ seconds. 4. Repeat this by turning to your other side. Repeat __________ times. Complete this exercise __________ times a day. Thoracic extension and pectoral stretch 1. Roll a towel or a small blanket so it is about 4 inches (10 cm) in diameter. 2. Lie down on your back on a firm surface. 3. Put the towel lengthwise, under your spine in the middle of your back. It should not be under your shoulder blades. The towel should line up with your spine from your middle back to your lower back. 4. Put your hands behind your head and let your elbows fall out to your sides. 5. Hold for __________ seconds. Repeat __________ times. Complete this exercise __________ times a day. Strengthening exercises Isometric upper cervical flexion 1. Lie on  your back with a thin pillow behind your head and a small rolled-up towel under your neck. 2. Gently tuck your chin toward your chest and nod your head down to look toward your feet. Do not lift your head off the pillow. 3. Hold for __________ seconds. 4. Release the tension slowly. Relax your neck muscles completely before you repeat this exercise. Repeat __________ times. Complete this exercise __________ times a day. Isometric cervical extension  1. Stand about 6 inches (15 cm) away from a wall, with your back facing the wall. 2. Place a soft object, about 6-8 inches (15-20 cm) in diameter, between the back of your head and the wall. A soft object could be a small pillow, a ball, or a folded towel. 3. Gently tilt your head back and press into the soft object. Keep your jaw and forehead relaxed. 4. Hold for __________ seconds. 5. Release the tension slowly. Relax your neck muscles completely before you repeat this exercise. Repeat __________ times. Complete this exercise __________ times a day. Posture and body mechanics Body mechanics refers to the movements and positions of your body while you do your daily activities. Posture is part of body mechanics. Good posture and healthy body mechanics can help to relieve stress in your body's tissues and joints. Good posture means that your spine is in its natural S-curve position (your spine is neutral), your shoulders are pulled back slightly, and your head is not tipped forward. The following are general guidelines for applying improved   posture and body mechanics to your everyday activities. Sitting  1. When sitting, keep your spine neutral and keep your feet flat on the floor. Use a footrest, if necessary, and keep your thighs parallel to the floor. Avoid rounding your shoulders, and avoid tilting your head forward. 2. When working at a desk or a computer, keep your desk at a height where your hands are slightly lower than your elbows. Slide your  chair under your desk so you are close enough to maintain good posture. 3. When working at a computer, place your monitor at a height where you are looking straight ahead and you do not have to tilt your head forward or downward to look at the screen. Standing   When standing, keep your spine neutral and keep your feet about hip-width apart. Keep a slight bend in your knees. Your ears, shoulders, and hips should line up.  When you do a task in which you stand in one place for a long time, place one foot up on a stable object that is 2-4 inches (5-10 cm) high, such as a footstool. This helps keep your spine neutral. Resting When lying down and resting, avoid positions that are most painful for you. Try to support your neck in a neutral position. You can use a contour pillow or a small rolled-up towel. Your pillow should support your neck but not push on it. This information is not intended to replace advice given to you by your health care provider. Make sure you discuss any questions you have with your health care provider. Document Revised: 12/27/2018 Document Reviewed: 06/07/2018 Elsevier Patient Education  [place].    Shoulder Exercises Ask your health care provider which exercises are safe for you. Do exercises exactly as told by your health care provider and adjust them as directed. It is normal to feel mild stretching, pulling, tightness, or discomfort as you do these exercises. Stop right away if you feel sudden pain or your pain gets worse. Do not begin these exercises until told by your health care provider. Stretching exercises External rotation and abduction This exercise is sometimes called corner stretch. This exercise rotates your arm outward (external rotation) and moves your arm out from your body (abduction). 5. Stand in a doorway with one of your feet slightly in front of the other. This is called a staggered stance. If you cannot reach your forearms to the door  frame, stand facing a corner of a room. 6. Choose one of the following positions as told by your health care provider: ? Place your hands and forearms on the door frame above your head. ? Place your hands and forearms on the door frame at the height of your head. ? Place your hands on the door frame at the height of your elbows. 7. Slowly move your weight onto your front foot until you feel a stretch across your chest and in the front of your shoulders. Keep your head and chest upright and keep your abdominal muscles tight. 8. Hold for __________ seconds. 9. To release the stretch, shift your weight to your back foot. Repeat __________ times. Complete this exercise __________ times a day. Extension, standing 5. Stand and hold a broomstick, a cane, or a similar object behind your back. ? Your hands should be a little wider than shoulder width apart. ? Your palms should face away from your back. 6. Keeping your elbows straight and your shoulder muscles relaxed, move the stick away from your body  until you feel a stretch in your shoulders (extension). ? Avoid shrugging your shoulders while you move the stick. Keep your shoulder blades tucked down toward the middle of your back. 7. Hold for __________ seconds. 8. Slowly return to the starting position. Repeat __________ times. Complete this exercise __________ times a day. Range-of-motion exercises Pendulum  6. Stand near a wall or a surface that you can hold onto for balance. 7. Bend at the waist and let your left / right arm hang straight down. Use your other arm to support you. Keep your back straight and do not lock your knees. 8. Relax your left / right arm and shoulder muscles, and move your hips and your trunk so your left / right arm swings freely. Your arm should swing because of the motion of your body, not because you are using your arm or shoulder muscles. 9. Keep moving your hips and trunk so your arm swings in the following  directions, as told by your health care provider: ? Side to side. ? Forward and backward. ? In clockwise and counterclockwise circles. 10. Continue each motion for __________ seconds, or for as long as told by your health care provider. 11. Slowly return to the starting position. Repeat __________ times. Complete this exercise __________ times a day. Shoulder flexion, standing  5. Stand and hold a broomstick, a cane, or a similar object. Place your hands a little more than shoulder width apart on the object. Your left / right hand should be palm up, and your other hand should be palm down. 6. Keep your elbow straight and your shoulder muscles relaxed. Push the stick up with your healthy arm to raise your left / right arm in front of your body, and then over your head until you feel a stretch in your shoulder (flexion). ? Avoid shrugging your shoulder while you raise your arm. Keep your shoulder blade tucked down toward the middle of your back. 7. Hold for __________ seconds. 8. Slowly return to the starting position. Repeat __________ times. Complete this exercise __________ times a day. Shoulder abduction, standing 6. Stand and hold a broomstick, a cane, or a similar object. Place your hands a little more than shoulder width apart on the object. Your left / right hand should be palm up, and your other hand should be palm down. 7. Keep your elbow straight and your shoulder muscles relaxed. Push the object across your body toward your left / right side. Raise your left / right arm to the side of your body (abduction) until you feel a stretch in your shoulder. ? Do not raise your arm above shoulder height unless your health care provider tells you to do that. ? If directed, raise your arm over your head. ? Avoid shrugging your shoulder while you raise your arm. Keep your shoulder blade tucked down toward the middle of your back. 8. Hold for __________ seconds. 9. Slowly return to the starting  position. Repeat __________ times. Complete this exercise __________ times a day. Internal rotation  4. Place your left / right hand behind your back, palm up. 5. Use your other hand to dangle an exercise band, a towel, or a similar object over your shoulder. Grasp the band with your left / right hand so you are holding on to both ends. 6. Gently pull up on the band until you feel a stretch in the front of your left / right shoulder. The movement of your arm toward the center of your body  is called internal rotation. ? Avoid shrugging your shoulder while you raise your arm. Keep your shoulder blade tucked down toward the middle of your back. 7. Hold for __________ seconds. 8. Release the stretch by letting go of the band and lowering your hands. Repeat __________ times. Complete this exercise __________ times a day. Strengthening exercises External rotation  1. Sit in a stable chair without armrests. 2. Secure an exercise band to a stable object at elbow height on your left / right side. 3. Place a soft object, such as a folded towel or a small pillow, between your left / right upper arm and your body to move your elbow about 4 inches (10 cm) away from your side. 4. Hold the end of the exercise band so it is tight and there is no slack. 5. Keeping your elbow pressed against the soft object, slowly move your forearm out, away from your abdomen (external rotation). Keep your body steady so only your forearm moves. 6. Hold for __________ seconds. 7. Slowly return to the starting position. Repeat __________ times. Complete this exercise __________ times a day. Shoulder abduction  1. Sit in a stable chair without armrests, or stand up. 2. Hold a __________ weight in your left / right hand, or hold an exercise band with both hands. 3. Start with your arms straight down and your left / right palm facing in, toward your body. 4. Slowly lift your left / right hand out to your side (abduction). Do  not lift your hand above shoulder height unless your health care provider tells you that this is safe. ? Keep your arms straight. ? Avoid shrugging your shoulder while you do this movement. Keep your shoulder blade tucked down toward the middle of your back. 5. Hold for __________ seconds. 6. Slowly lower your arm, and return to the starting position. Repeat __________ times. Complete this exercise __________ times a day. Shoulder extension 1. Sit in a stable chair without armrests, or stand up. 2. Secure an exercise band to a stable object in front of you so it is at shoulder height. 3. Hold one end of the exercise band in each hand. Your palms should face each other. 4. Straighten your elbows and lift your hands up to shoulder height. 5. Step back, away from the secured end of the exercise band, until the band is tight and there is no slack. 6. Squeeze your shoulder blades together as you pull your hands down to the sides of your thighs (extension). Stop when your hands are straight down by your sides. Do not let your hands go behind your body. 7. Hold for __________ seconds. 8. Slowly return to the starting position. Repeat __________ times. Complete this exercise __________ times a day. Shoulder row 1. Sit in a stable chair without armrests, or stand up. 2. Secure an exercise band to a stable object in front of you so it is at waist height. 3. Hold one end of the exercise band in each hand. Position your palms so that your thumbs are facing the ceiling (neutral position). 4. Bend each of your elbows to a 90-degree angle (right angle) and keep your upper arms at your sides. 5. Step back until the band is tight and there is no slack. 6. Slowly pull your elbows back behind you. 7. Hold for __________ seconds. 8. Slowly return to the starting position. Repeat __________ times. Complete this exercise __________ times a day. Shoulder press-ups  1. Sit in a stable chair that  has armrests.  Sit upright, with your feet flat on the floor. 2. Put your hands on the armrests so your elbows are bent and your fingers are pointing forward. Your hands should be about even with the sides of your body. 3. Push down on the armrests and use your arms to lift yourself off the chair. Straighten your elbows and lift yourself up as much as you comfortably can. ? Move your shoulder blades down, and avoid letting your shoulders move up toward your ears. ? Keep your feet on the ground. As you get stronger, your feet should support less of your body weight as you lift yourself up. 4. Hold for __________ seconds. 5. Slowly lower yourself back into the chair. Repeat __________ times. Complete this exercise __________ times a day. Wall push-ups  1. Stand so you are facing a stable wall. Your feet should be about one arm-length away from the wall. 2. Lean forward and place your palms on the wall at shoulder height. 3. Keep your feet flat on the floor as you bend your elbows and lean forward toward the wall. 4. Hold for __________ seconds. 5. Straighten your elbows to push yourself back to the starting position. Repeat __________ times. Complete this exercise __________ times a day. This information is not intended to replace advice given to you by your health care provider. Make sure you discuss any questions you have with your health care provider. Document Revised: 12/29/2018 Document Reviewed: 10/06/2018 Elsevier Patient Education  [place].

## 2019-12-09 ENCOUNTER — Encounter (HOSPITAL_BASED_OUTPATIENT_CLINIC_OR_DEPARTMENT_OTHER): Payer: Self-pay

## 2019-12-09 ENCOUNTER — Other Ambulatory Visit: Payer: Self-pay

## 2019-12-09 ENCOUNTER — Emergency Department (HOSPITAL_BASED_OUTPATIENT_CLINIC_OR_DEPARTMENT_OTHER)
Admission: EM | Admit: 2019-12-09 | Discharge: 2019-12-09 | Disposition: A | Discharge status: Home / Self Care | Payer: 59 | Attending: Emergency Medicine | Admitting: Emergency Medicine

## 2019-12-09 ENCOUNTER — Emergency Department (HOSPITAL_BASED_OUTPATIENT_CLINIC_OR_DEPARTMENT_OTHER): Payer: 59

## 2019-12-09 DIAGNOSIS — Z79899 Other long term (current) drug therapy: Secondary | ICD-10-CM | POA: Diagnosis not present

## 2019-12-09 DIAGNOSIS — M545 Low back pain: Secondary | ICD-10-CM | POA: Insufficient documentation

## 2019-12-09 DIAGNOSIS — R1031 Right lower quadrant pain: Secondary | ICD-10-CM | POA: Diagnosis not present

## 2019-12-09 LAB — COMPREHENSIVE METABOLIC PANEL
ALT: 17 U/L (ref 0–44)
AST: 27 U/L (ref 15–41)
Albumin: 4 g/dL (ref 3.5–5.0)
Alkaline Phosphatase: 67 U/L (ref 38–126)
Anion gap: 6 (ref 5–15)
BUN: 11 mg/dL (ref 6–20)
CO2: 23 mmol/L (ref 22–32)
Calcium: 9.1 mg/dL (ref 8.9–10.3)
Chloride: 107 mmol/L (ref 98–111)
Creatinine, Ser: 0.83 mg/dL (ref 0.44–1.00)
GFR calc Af Amer: >60 mL/min (ref >60)
GFR calc non Af Amer: >60 mL/min (ref >60)
Glucose, Bld: 95 mg/dL (ref 70–99)
Potassium: 4.5 mmol/L (ref 3.5–5.1)
Sodium: 136 mmol/L (ref 135–145)
Total Bilirubin: 0.6 mg/dL (ref 0.3–1.2)
Total Protein: 7.6 g/dL (ref 6.5–8.1)

## 2019-12-09 LAB — URINALYSIS, ROUTINE W REFLEX MICROSCOPIC
Bilirubin Urine: NEGATIVE
Glucose, UA: NEGATIVE mg/dL
Hgb urine dipstick: NEGATIVE
Ketones, ur: NEGATIVE mg/dL
Leukocytes,Ua: NEGATIVE
Nitrite: NEGATIVE
Protein, ur: NEGATIVE mg/dL
Specific Gravity, Urine: 1.02 (ref 1.005–1.030)
pH: 5.5 (ref 5.0–8.0)

## 2019-12-09 LAB — CBC WITH DIFFERENTIAL/PLATELET
Abs Immature Granulocytes: 0.01 10*3/uL (ref 0.00–0.07)
Basophils Absolute: 0 10*3/uL (ref 0.0–0.1)
Basophils Relative: 1 %
Eosinophils Absolute: 0.1 10*3/uL (ref 0.0–0.5)
Eosinophils Relative: 1 %
HCT: 41.9 % (ref 36.0–46.0)
Hemoglobin: 13.9 g/dL (ref 12.0–15.0)
Immature Granulocytes: 0 %
Lymphocytes Relative: 37 %
Lymphs Abs: 1.6 10*3/uL (ref 0.7–4.0)
MCH: 28.8 pg (ref 26.0–34.0)
MCHC: 33.2 g/dL (ref 30.0–36.0)
MCV: 86.7 fL (ref 80.0–100.0)
Monocytes Absolute: 0.4 10*3/uL (ref 0.1–1.0)
Monocytes Relative: 9 %
Neutro Abs: 2.3 10*3/uL (ref 1.7–7.7)
Neutrophils Relative %: 52 %
Platelets: 259 10*3/uL (ref 150–400)
RBC: 4.83 MIL/uL (ref 3.87–5.11)
RDW: 13.6 % (ref 11.5–15.5)
WBC: 4.4 10*3/uL (ref 4.0–10.5)
nRBC: 0 % (ref 0.0–0.2)

## 2019-12-09 LAB — LIPASE, BLOOD: Lipase: 23 U/L (ref 11–51)

## 2019-12-09 LAB — PREGNANCY, URINE: Preg Test, Ur: NEGATIVE

## 2019-12-09 MED ORDER — NAPROXEN 500 MG PO TABS: 500.0000 mg | ORAL_TABLET | Freq: Two times a day (BID) | ORAL | 0 refills | Status: DC

## 2019-12-09 MED ORDER — METHOCARBAMOL 500 MG PO TABS: 500.0000 mg | ORAL_TABLET | Freq: Two times a day (BID) | ORAL | 0 refills | Status: DC

## 2019-12-09 MED ORDER — FENTANYL CITRATE (PF) 100 MCG/2ML IJ SOLN
50.0000 ug | Freq: Once | INTRAMUSCULAR | Status: AC
  Administered 2019-12-09: 50 ug via INTRAVENOUS
  Filled 2019-12-09: qty 2

## 2019-12-09 MED ORDER — IOHEXOL 300 MG/ML  SOLN
100.0000 mL | Freq: Once | INTRAMUSCULAR | Status: AC | PRN
  Administered 2019-12-09: 100 mL via INTRAVENOUS

## 2019-12-09 NOTE — ED Provider Notes (Signed)
[facility] EMERGENCY DEPARTMENT Provider Note   CSN: [id number] Arrival date & time: 12/09/19  1149     History Chief Complaint  Patient presents with   Flank Pain    Brianna Lowery is a 54 y.o. female with a past medical history significant for prediabetes who presents to the ED due to right lower quadrant pain that radiates to her right low back x1 week.  [facility] just [facility] to arrival and sent to the ED to rule out appendicitis and kidney stone.  Patient notes pain is worse when lying on that side and with palpation.  Denies associative fever, chills, nausea, vomiting, diarrhea.  Denies urinary and vaginal symptoms.  She has tried Tylenol, Motrin, and hot compresses with no relief.  She is not currently sexually active since September.  No concern for STDs at this time.  She admits to having a kidney or ovarian cyst roughly 10 years ago.  Denies previous abdominal operations however through chart review notes patient has had a C-section and tubal ligation.  History obtained from patient and past medical records. No interpreter used during encounter.      History reviewed. No pertinent past medical history.  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     No family history on file.  Social History   Tobacco Use   Smoking status: Never Smoker   Smokeless tobacco: Never Used  Substance Use Topics   Alcohol use: No   Drug use: No    Home Medications Prior to Admission medications   Medication Sig Start Date End Date Taking? Authorizing Provider  dicyclomine (BENTYL) 20 MG tablet Take 1 tablet  (20 mg total) by mouth 4 (four) times daily -  before meals and at bedtime. 08/23/19   Emeterio Reeve, DO  gabapentin (NEURONTIN) 300 MG capsule Take 1-2 capsules (300-600 mg total) by mouth 3 (three) times daily. 11/28/19   Emeterio Reeve, DO  methocarbamol (ROBAXIN) 500 MG tablet Take 1 tablet (500 mg total) by mouth 2 (two) times daily. 12/09/19   Suzy Bouchard, PA-C  naproxen (NAPROSYN) 500 MG tablet Take 1 tablet (500 mg total) by mouth 2 (two) times daily. 12/09/19   Suzy Bouchard, PA-C  ondansetron (ZOFRAN-ODT) 4 MG disintegrating tablet  08/12/19   [provider]  pantoprazole (PROTONIX) 40 MG tablet Take 1 tablet (40 mg total) by mouth daily. 08/23/19   Emeterio Reeve, DO    Allergies    Patient has no known allergies.  Review of Systems   Review of Systems  Constitutional: Negative for chills and fever.  Respiratory: Negative for shortness of breath.   Cardiovascular: Negative for chest pain.  Gastrointestinal: Positive for abdominal pain. Negative for diarrhea, nausea and vomiting.  Genitourinary: Negative for dysuria, vaginal discharge and vaginal pain.  All other systems reviewed and are negative.   Physical Exam Updated Vital Signs BP 121/76 (BP Location: Right Arm)    Pulse 66    Temp 98.4 F (36.9 C) (Oral)    Resp 16    Ht 5\' 2"  (1.575 m)  Wt 73 kg    LMP 10/05/2017    SpO2 100%    BMI 29.45 kg/m   Physical Exam Vitals and nursing note reviewed.  Constitutional:      General: She is not in acute distress.    Appearance: She is not toxic-appearing.  HENT:     Head: Normocephalic.  Eyes:     Pupils: Pupils are equal, round, and reactive to light.  Cardiovascular:     Rate and Rhythm: Normal rate and regular rhythm.     Pulses: Normal pulses.     Heart sounds: Normal heart sounds. No murmur. No friction rub. No gallop.   Pulmonary:     Effort: Pulmonary effort is normal.     Breath sounds: Normal breath sounds.  Abdominal:      General: Abdomen is flat. Bowel sounds are normal. There is no distension.     Palpations: Abdomen is soft.     Tenderness: There is abdominal tenderness. There is right CVA tenderness. There is no left CVA tenderness, guarding or rebound.     Comments: Tenderness palpation in right lower quadrant.  No rebound or guarding.  Positive right CVA tenderness.  Musculoskeletal:     Cervical back: Neck supple.     Comments: Able to move all 4 extremities without difficulty.  Skin:    General: Skin is warm and dry.  Neurological:     General: No focal deficit present.     Mental Status: She is alert.  Psychiatric:        Mood and Affect: Mood normal.        Behavior: Behavior normal.     ED Results / Procedures / Treatments   Labs (all labs ordered are listed, but only abnormal results are displayed) Labs Reviewed  CBC WITH DIFFERENTIAL/PLATELET  COMPREHENSIVE METABOLIC PANEL  LIPASE, BLOOD  URINALYSIS, ROUTINE W REFLEX MICROSCOPIC  PREGNANCY, URINE    EKG None  Radiology CT ABDOMEN PELVIS W CONTRAST  Result Date: 12/09/2019 CLINICAL DATA:  Right lower quadrant pain for 1 week. History of C-section. EXAM: CT ABDOMEN AND PELVIS WITH CONTRAST TECHNIQUE: Multidetector CT imaging of the abdomen and pelvis was performed using the standard protocol following bolus administration of intravenous contrast. CONTRAST:  117mL OMNIPAQUE IOHEXOL 300 MG/ML  SOLN COMPARISON:  None. FINDINGS: Lower chest: No acute abnormality. Hepatobiliary: Tiny hypodensity in the anterior left hepatic lobe is too small to fully characterize but likely represents a cyst. Pancreas: Unremarkable. No pancreatic ductal dilatation or surrounding inflammatory changes. Spleen: Normal in size without focal abnormality. Adrenals/Urinary Tract: Adrenal glands are unremarkable. Kidneys are symmetric. No hydronephrosis or renal calculi. There is a cyst in the midpole of the left kidney. Urinary bladder is unremarkable. Stomach/Bowel:  Stomach is within normal limits. Appendix appears normal. No evidence of bowel wall thickening, distention, or inflammatory changes. Few scattered colonic diverticula. Vascular/Lymphatic: No significant vascular findings are present. No enlarged abdominal or pelvic lymph nodes. Reproductive: Status post bilateral tubal ligation. There is a rim enhancing cyst in the left adnexa, likely a corpus luteum trace free fluid in the pelvis, likely physiologic. Other: No abdominal wall hernia or other abnormality. Musculoskeletal: No acute or significant osseous findings. IMPRESSION: No evidence of acute intra-abdominal pathology. Electronically Signed   By: Audie Pinto M.D.   On: 12/09/2019 15:11    Procedures Procedures (including critical care time)  Medications Ordered in ED Medications  fentaNYL (SUBLIMAZE) injection 50 mcg (50 mcg Intravenous Given 12/09/19 1313)  iohexol (OMNIPAQUE) 300  MG/ML solution 100 mL (100 mLs Intravenous Contrast Given 12/09/19 1421)    ED Course  I have reviewed the triage vital signs and the nursing notes.  Pertinent labs & imaging results that were available during my care of the patient were reviewed by me and considered in my medical decision making (see chart for details).    MDM Rules/Calculators/A&P                     54 year old female [facility] due to right lower quadrant pain that radiates to her right low back x1 week.  Vitals all within normal limits.  Patient is afebrile, not tachycardic or hypoxic.  Patient no acute distress and nontoxic-appearing.  Tenderness palpation in the right lower quadrant with right CVA tenderness. Will obtain routine labs and CT abdomen to rule out appendicitis and kidney stones.  Work-up with no leukocytosis. UA unremarkable with no signs of infection or hematuria. Urine pregnancy negative.  CBC unremarkable with no leukocytosis.  Lipase normal at 23.  CMP unremarkable with normal renal function and no  electrolyte derangements.  CT abdomen personally reviewed which is negative for any intra-abdominal etiologies that could be causing patient's pain.   Will discharge patient with symptomatic treatment. Prior to discharge, asked patient again about sexual activity and she notes she has not been sexually active since September. Low suspicion for GU etiology. Discussed case with Dr. Roslynn Amble who evaluated patient at bedside and agrees with assessment and plan.  Patient to follow-up with PCP within the next week if symptoms do not improve. Strict ED precautions discussed with patient. Patient states understanding and agrees to plan. Patient discharged home in no acute distress and stable vitals. Final Clinical Impression(s) / ED Diagnoses Final diagnoses:  Right lower quadrant abdominal pain    Rx / DC Orders ED Discharge Orders         Ordered    naproxen (NAPROSYN) 500 MG tablet  2 times daily     12/09/19 1553    methocarbamol (ROBAXIN) 500 MG tablet  2 times daily     12/09/19 1553           Karie Kirks 12/09/19 1556    Lucrezia Starch, MD 12/10/19 [phone]

## 2019-12-09 NOTE — ED Triage Notes (Signed)
Pt c/o right flank pain X1 week states she has been using tylenol, motrin, and hot compresses at home with pain getting worse, was seen at [facility], states the provider palpated the area in her RLQ and the pain got worse. Denies any medications today. Denies NVD, reports "urine was strong 5 days ago".

## 2019-12-09 NOTE — Discharge Instructions (Addendum)
As discussed, your CT scan and labs were all unremarkable.  I am sending you home with naproxen and Robaxin as needed for pain.  If symptoms do not improve within the next week.  Follow-up with PCP for further evaluation.  Return to the ER for new or worsening symptoms.

## 2019-12-12 ENCOUNTER — Other Ambulatory Visit: Payer: Self-pay

## 2019-12-12 ENCOUNTER — Encounter: Payer: Self-pay | Admitting: Osteopathic Medicine

## 2019-12-12 ENCOUNTER — Ambulatory Visit (INDEPENDENT_AMBULATORY_CARE_PROVIDER_SITE_OTHER): Payer: 59

## 2019-12-12 ENCOUNTER — Ambulatory Visit (INDEPENDENT_AMBULATORY_CARE_PROVIDER_SITE_OTHER): Payer: 59 | Admitting: Osteopathic Medicine

## 2019-12-12 VITALS — BP 114/77 | HR 66 | Temp 98.2°F | Wt 158.1 lb

## 2019-12-12 DIAGNOSIS — N858 Other specified noninflammatory disorders of uterus: Secondary | ICD-10-CM | POA: Diagnosis not present

## 2019-12-12 DIAGNOSIS — Z9851 Tubal ligation status: Secondary | ICD-10-CM

## 2019-12-12 DIAGNOSIS — Z8742 Personal history of other diseases of the female genital tract: Secondary | ICD-10-CM

## 2019-12-12 DIAGNOSIS — Z98891 History of uterine scar from previous surgery: Secondary | ICD-10-CM | POA: Diagnosis not present

## 2019-12-12 DIAGNOSIS — R1031 Right lower quadrant pain: Secondary | ICD-10-CM

## 2019-12-12 NOTE — Progress Notes (Signed)
Brianna Lowery is a 54 y.o. female who presents to  [place] at [facility]  today, 12/12/19, seeking care for the following: . ER follow-up abdominal pain      ASSESSMENT & PLAN with other pertinent history/findings:  The primary encounter diagnosis was Continuous RLQ abdominal pain. Diagnoses of History of ovarian cyst, History of cesarean section, and History of bilateral tubal ligation were also pertinent to this visit.  ER records reviewed 12/09/2019: Sent by [facility] to eval for appendicitis, renal stone   Pain RLQ rad to R lower back x1 week, worse lying on her R side, tender to palpation. (+)RLQ and R CVA tenderness on exam. VSS. CBC, UA, UPT, Lipase, CMP all ok. CT Abdomen ok. D/c home w/ Rx naproxen and methocarbamol.   Today 12/12/19:  Same pain, feels cramping, very similar to previous ovarian cyst rupture. No N/V, no fever, pain no different with food or with BM, no voiding issues or hematuria, no vaginal discharge. On exam, (+)TTP RLQ, BS WNL, Neg SLR/obturator sign on R, no midline spinal tenderness.     Patient Instructions  Plan:  Let's get an ultrasound to look at the ovaries/uterus. Ultrasound is scheduled today in the [place] downstairs at 10:00, please arrive there at 9:45.   I am concerned this might be a ruptured cyst, scar tissue from previous surgeries. I think we should get OBGYN opinion depending on what we see on the ultrasound.   Let's keep you out of work for a bit! See attached work note.         Orders Placed This Encounter  Procedures  . US PELVIS TRANSVAGINAL NON-OB (TV ONLY)  . US Pelvic Complete With Transvaginal        Follow-up instructions: Return for RECHECK PENDING RESULTS / IF WORSE OR CHANGE.                                         LMP 10/05/2017   No outpatient medications have been marked as  taking for the 12/12/19 encounter (Appointment) with Emeterio Reeve, DO.    Results for orders placed or performed during the hospital encounter of 12/09/19 (from the past 72 hour(s))  CBC with Differential     Status: None   Collection Time: 12/09/19 12:30 PM  Result Value Ref Range   WBC 4.4 4.0 - 10.5 K/uL   RBC 4.83 3.87 - 5.11 MIL/uL   Hemoglobin 13.9 12.0 - 15.0 g/dL   HCT 41.9 36.0 - 46.0 %   MCV 86.7 80.0 - 100.0 fL   MCH 28.8 26.0 - 34.0 pg   MCHC 33.2 30.0 - 36.0 g/dL   RDW 13.6 11.5 - 15.5 %   Platelets 259 150 - 400 K/uL   nRBC 0.0 0.0 - 0.2 %   Neutrophils Relative % 52 %   Neutro Abs 2.3 1.7 - 7.7 K/uL   Lymphocytes Relative 37 %   Lymphs Abs 1.6 0.7 - 4.0 K/uL   Monocytes Relative 9 %   Monocytes Absolute 0.4 0.1 - 1.0 K/uL   Eosinophils Relative 1 %   Eosinophils Absolute 0.1 0.0 - 0.5 K/uL   Basophils Relative 1 %   Basophils Absolute 0.0 0.0 - 0.1 K/uL   Immature Granulocytes 0 %   Abs Immature Granulocytes 0.01 0.00 - 0.07 K/uL    Comment:  Performed at [facility], [address] [place], [place] [address]  Comprehensive metabolic panel     Status: None   Collection Time: 12/09/19 12:30 PM  Result Value Ref Range   Sodium 136 135 - 145 mmol/L   Potassium 4.5 3.5 - 5.1 mmol/L    Comment: SLIGHT HEMOLYSIS   Chloride 107 98 - 111 mmol/L   CO2 23 22 - 32 mmol/L   Glucose, Bld 95 70 - 99 mg/dL    Comment: Glucose reference range applies only to samples taken after fasting for at least 8 hours.   BUN 11 6 - 20 mg/dL   Creatinine, Ser 0.83 0.44 - 1.00 mg/dL   Calcium 9.1 8.9 - 10.3 mg/dL   Total Protein 7.6 6.5 - 8.1 g/dL   Albumin 4.0 3.5 - 5.0 g/dL   AST 27 15 - 41 U/L    Comment: SLIGHT HEMOLYSIS   ALT 17 0 - 44 U/L    Comment: SLIGHT HEMOLYSIS   Alkaline Phosphatase 67 38 - 126 U/L    Comment: SLIGHT HEMOLYSIS   Total Bilirubin 0.6 0.3 - 1.2 mg/dL    Comment: SLIGHT HEMOLYSIS   GFR calc non Af Amer >60 >60 mL/min   GFR calc Af Amer  >60 >60 mL/min   Anion gap 6 5 - 15    Comment: Performed at [facility], [address] [place], [place] [address]  Lipase, blood     Status: None   Collection Time: 12/09/19 12:30 PM  Result Value Ref Range   Lipase 23 11 - 51 U/L    Comment: Performed at [facility], [address] [place], [place] [address]  Urinalysis, Routine w reflex microscopic     Status: None   Collection Time: 12/09/19 12:31 PM  Result Value Ref Range   Color, Urine YELLOW YELLOW   APPearance CLEAR CLEAR   Specific Gravity, Urine 1.020 1.005 - 1.030   pH 5.5 5.0 - 8.0   Glucose, UA NEGATIVE NEGATIVE mg/dL   Hgb urine dipstick NEGATIVE NEGATIVE   Bilirubin Urine NEGATIVE NEGATIVE   Ketones, ur NEGATIVE NEGATIVE mg/dL   Protein, ur NEGATIVE NEGATIVE mg/dL   Nitrite NEGATIVE NEGATIVE   Leukocytes,Ua NEGATIVE NEGATIVE    Comment: Microscopic not done on urines with negative protein, blood, leukocytes, nitrite, or glucose < 500 mg/dL. Performed at [facility], [address] [place], [place] [address]   Pregnancy, urine     Status: None   Collection Time: 12/09/19 12:31 PM  Result Value Ref Range   Preg Test, Ur NEGATIVE NEGATIVE    Comment:        THE SENSITIVITY OF THIS METHODOLOGY IS >20 mIU/mL. Performed at [facility], [address] [place], [place] [address]     CT ABDOMEN PELVIS W CONTRAST  Result Date: 12/09/2019 CLINICAL DATA:  Right lower quadrant pain for 1 week. History of C-section. EXAM: CT ABDOMEN AND PELVIS WITH CONTRAST TECHNIQUE: Multidetector CT imaging of the abdomen and pelvis was performed using the standard protocol following bolus administration of intravenous contrast. CONTRAST:  162mL OMNIPAQUE IOHEXOL 300 MG/ML  SOLN COMPARISON:  None. FINDINGS: Lower chest: No acute abnormality. Hepatobiliary: Tiny hypodensity in the anterior left hepatic lobe is too small to fully characterize but likely represents a cyst. Pancreas:  Unremarkable. No pancreatic ductal dilatation or surrounding inflammatory changes. Spleen: Normal in size without focal abnormality. Adrenals/Urinary Tract: Adrenal glands  are unremarkable. Kidneys are symmetric. No hydronephrosis or renal calculi. There is a cyst in the midpole of the left kidney. Urinary bladder is unremarkable. Stomach/Bowel: Stomach is within normal limits. Appendix appears normal. No evidence of bowel wall thickening, distention, or inflammatory changes. Few scattered colonic diverticula. Vascular/Lymphatic: No significant vascular findings are present. No enlarged abdominal or pelvic lymph nodes. Reproductive: Status post bilateral tubal ligation. There is a rim enhancing cyst in the left adnexa, likely a corpus luteum trace free fluid in the pelvis, likely physiologic. Other: No abdominal wall hernia or other abnormality. Musculoskeletal: No acute or significant osseous findings. IMPRESSION: No evidence of acute intra-abdominal pathology. Electronically Signed   By: Audie Pinto M.D.   On: 12/09/2019 15:11    Depression screen [facility] 2/9 08/23/2019 07/18/2019 10/12/2017  Decreased Interest 0 0 0  Down, Depressed, Hopeless 0 0 0  PHQ - 2 Score 0 0 0    GAD 7 : Generalized Anxiety Score 08/23/2019 07/18/2019  Nervous, Anxious, on Edge 0 0  Control/stop worrying 0 0  Worry too much - different things 0 0  Trouble relaxing 0 0  Restless 0 0  Easily annoyed or irritable 0 0  Afraid - awful might happen 0 0  Total GAD 7 Score 0 0      All questions at time of visit were answered - patient instructed to contact office with any additional concerns or updates.  ER/RTC precautions were reviewed with the patient.  Please note: voice recognition software was used to produce this document, and typos may escape review. Please contact Dr. Sheppard Coil for any needed clarifications.

## 2019-12-12 NOTE — Patient Instructions (Addendum)
Plan:  Let's get an ultrasound to look at the ovaries/uterus. Ultrasound is scheduled today in the [place] downstairs at 10:00, please arrive there at 9:45.   I am concerned this might be a ruptured cyst, scar tissue from previous surgeries. I think we should get OBGYN opinion depending on what we see on the ultrasound.   Let's keep you out of work for a bit! See attached work note.

## 2019-12-13 ENCOUNTER — Telehealth: Payer: Self-pay

## 2019-12-13 NOTE — Telephone Encounter (Signed)
Pt called requesting U/S results. Pt viewed results and provider's note via MyChart.

## 2019-12-19 ENCOUNTER — Encounter: Payer: Self-pay | Admitting: Rehabilitative and Restorative Service Providers"

## 2019-12-19 ENCOUNTER — Telehealth: Payer: Self-pay

## 2019-12-19 ENCOUNTER — Ambulatory Visit: Payer: 59 | Admitting: Rehabilitative and Restorative Service Providers"

## 2019-12-19 ENCOUNTER — Other Ambulatory Visit: Payer: Self-pay

## 2019-12-19 ENCOUNTER — Encounter: Payer: Self-pay | Admitting: Osteopathic Medicine

## 2019-12-19 DIAGNOSIS — R29898 Other symptoms and signs involving the musculoskeletal system: Secondary | ICD-10-CM

## 2019-12-19 DIAGNOSIS — M25512 Pain in left shoulder: Secondary | ICD-10-CM | POA: Diagnosis not present

## 2019-12-19 DIAGNOSIS — R293 Abnormal posture: Secondary | ICD-10-CM

## 2019-12-19 DIAGNOSIS — N8003 Adenomyosis of the uterus: Secondary | ICD-10-CM

## 2019-12-19 DIAGNOSIS — M542 Cervicalgia: Secondary | ICD-10-CM | POA: Diagnosis not present

## 2019-12-19 DIAGNOSIS — N800 Endometriosis of uterus: Secondary | ICD-10-CM

## 2019-12-19 NOTE — Patient Instructions (Signed)
Access Code: [email or web address]: [email or web address]: 03/31/2021Prepared by: Jameshia Hayashida HoltExercises  Supine Chin Tuck - 2 x daily - 7 x weekly - 1 sets - 5 reps - 10 sec hold  Seated Cervical Retraction - 2 x daily - 7 x weekly - 1 sets - 5 reps - 10 sec hold  Seated Scapular Retraction - 2 x daily - 7 x weekly - 1 sets - 10 reps - 10 sec hold  Seated Shoulder Scaption AAROM with Pulley at Side - 2 x daily - 7 x weekly - 1 sets - 10 reps - 10 sec hold  Seated Shoulder Abduction AAROM with Pulley Behind - 2 x daily - 7 x weekly - 1 sets - 10 reps - 10 sec hold Patient Education  TENS Unit

## 2019-12-19 NOTE — Therapy (Addendum)
[place], [place], [address] Phone: [phone]   Fax:  [phone]  Physical Therapy Evaluation  Patient Details  Name: BLANCH STANG MRN: [id number] Date of Birth: 1966/02/03 Referring Provider (PT): Dr Emeterio Reeve    Encounter Date: 12/19/2019  PT End of Session - 12/19/19 1248    Visit Number  1    Number of Visits  12    Date for PT Re-Evaluation  01/23/20    PT Start Time  1107    PT Stop Time  1201    PT Time Calculation (min)  54 min    Activity Tolerance  Patient tolerated treatment well       History reviewed. No pertinent past medical history.  Past Surgical History:  Procedure Laterality Date  . CESAREAN SECTION    . ESSURE TUBAL LIGATION  2014    There were no vitals filed for this visit.   Subjective Assessment - 12/19/19 1056    Subjective  Patient reports that she has been having pain in the Lt upper quarter for 2-31month with no known injury. Patient also reports that she has had Rt LBP related to ovary problem.    Pertinent History  unremarkable per patient report    Currently in Pain?  Yes    Pain Score  3     Pain Orientation  Left    Pain Descriptors / Indicators  Dull    Pain Type  Acute pain    Pain Radiating Towards  whole arm to fingers    Pain Onset  More than a month ago    Pain Frequency  Intermittent    Aggravating Factors   just comes and goes; reaching; reaching back; lift overhead    Pain Relieving Factors  lying on Rt side; medicine may help for a short time         [facility] - 12/19/19 0001      Assessment   Medical Diagnosis  Rt upper quadrant pain     Referring Provider (PT)  Dr NEmeterio Reeve    Onset Date/Surgical Date  10/05/19   estimate    Hand Dominance  Right    Next MD Visit  PRN     Prior Therapy  none       Precautions   Precautions  None      Balance Screen   Has the patient fallen in the past  6 months  No    Has the patient had a decrease in activity level because of a fear of falling?   No    Is the patient reluctant to leave their home because of a fear of falling?   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     Leisure  household chores; home school 89and 89yr old children; reading       Observation/Other Assessments   Focus on Therapeutic Outcomes (FOTO)   51% limitation       Sensation   Additional Comments  intermittent tingling in Lt UE/hand       Posture/Postural Control   Posture Comments  head forward and often held to the Lt side; shoudlers rounded and elevated      AROM   Right Shoulder Extension  32 Degrees    Right Shoulder Flexion  146 Degrees    Right Shoulder ABduction  153 Degrees    Right Shoulder Internal Rotation  --   thumb to T7   Right Shoulder External Rotation  52 Degrees    Left Shoulder Extension  30 Degrees    Left Shoulder Flexion  120 Degrees   pain   Left Shoulder ABduction  84 Degrees   pain    Left Shoulder Internal Rotation  --   hand to waist - pain   Left Shoulder External Rotation  52 Degrees    Cervical Flexion  50    Cervical Extension  46    Cervical - Right Side Bend  40    Cervical - Left Side Bend  34   pain Lt cervical area   Cervical - Right Rotation  45   pain Lt cervical area   Cervical - Left Rotation  52   pain Lt cervical area      Strength   Overall Strength Comments  moves libs against gravity - strengthe not tested resistively       Palpation   Palpation comment  tightness int Lt ant/lat/posterior cervical musculature; pecs; upper trap; leveator                 Objective measurements completed on examination: See above  findings.      [facility] Adult PT Treatment/Exercise - 12/19/19 0001      Neuro Re-ed    Neuro Re-ed Details   postural correction using noodle       Shoulder Exercises: Standing   Other Standing Exercises  axial extension 5 sec x 5; scap squeeze 10 sec x 5       Shoulder Exercises: Pulleys   Flexion  --   10 sec hold x 5   Scaption  --   10 sec hold x 5      Moist Heat Therapy   Number Minutes Moist Heat  14 Minutes    Moist Heat Location  Cervical;Shoulder      Electrical Stimulation   Electrical Stimulation Location  Lt cervical and shoulder girdle area     Electrical Stimulation Action  IFC    Electrical Stimulation Parameters  to tolerance    Electrical Stimulation Goals  Pain;Tone             PT Education - 12/19/19 1248    Education Details  HEP; postural correction; TENS; POC    Person(s) Educated  Patient    Methods  Explanation;Demonstration;Tactile cues;Verbal cues;Handout    Comprehension  Verbalized understanding;Returned demonstration;Verbal cues required;Tactile cues required          PT Long Term Goals - 12/19/19 1256      PT LONG TERM GOAL #1   Title  Improve ROM Lt shoulder to equal ROM Rt shoulder with minimal to no pain    Time  6    Period  Weeks    Status  New    Target Date  01/23/20      PT LONG TERM GOAL #2   Title  Increase cervical mobility and ROM to WFL's and pain free with all motions    Time  6    Period  Weeks    Status  New    Target Date  01/23/20  PT LONG TERM GOAL #3   Title  Decrease pain and radicular symptoms Lt UE by 50-75% allowing patient to return to all normal functional activities    Time  6    Period  Weeks    Status  New    Target Date  01/23/20      PT LONG TERM GOAL #4   Title  Independent in HEP    Time  6    Period  Weeks    Status  New    Target Date  01/23/20      PT LONG TERM GOAL #5   Title  Improve FOTO to </= 33% limitation    Time  6    Period  Weeks    Status  New    Target  Date  01/23/20             Plan - 12/19/19 1249    Clinical Impression Statement  Patient presents with 2-3 month history of Rt shoulder pain with radicular numbness and tingling into the Lt UE. She has limited cervical and Lt shoulder ROM and function. Patient has signs and symptoms suggestive of early signs  of Lt shoulder adhesive capsulitis(shoulder symptoms started before radicular symptoms and are clasic of adhesive capsulitis). She also has cervical radicular symtpoms which started after shoulder pain and are increased with movemets of shoulder and head/neck. Patient sits with head slightly turned and tilted to the Lt with Lt shoulder girdle elevated. She will benefit from PT to address problems identified.    Stability/Clinical Decision Making  Stable/Uncomplicated    Clinical Decision Making  Low    Rehab Potential  Good    PT Frequency  2x / week    PT Duration  6 weeks    PT Treatment/Interventions  ADLs/Self Care Home Management;Therapeutic activities;Cryotherapy;Electrical Stimulation;Iontophoresis 28m/ml Dexamethasone;Moist Heat;Ultrasound;Patient/family education;Manual techniques;Passive range of motion;Dry needling;Taping    PT Next Visit Plan  review HEP; progress with axial extension and cervical exercises; progress with ROM exercises for Rt shoulder; continue assessment as indicated; add manual work for cervical and Lt upper quarter areas; modalities as indicated (suggested TENS unit for home)    PT [facility] [place] Agree with Plan of Care  Patient       Patient will benefit from skilled therapeutic intervention in order to improve the following deficits and impairments:  Increased fascial restricitons, Impaired UE functional use, Decreased activity tolerance, Pain, Impaired flexibility, Improper body mechanics, Decreased mobility, Postural dysfunction  Visit Diagnosis: Acute pain of left shoulder - Plan: PT plan of care  cert/re-cert  Cervicalgia - Plan: PT plan of care cert/re-cert  Other symptoms and signs involving the musculoskeletal system - Plan: PT plan of care cert/re-cert  Abnormal posture - Plan: PT plan of care cert/re-cert     Problem List Patient Active Problem List   Diagnosis Date Noted  . 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 Prediabetes 11/22/2017  . Deafness in left ear 11/22/2017  . History of gestational diabetes 10/12/2017    CEverardo AllPT, MPH  12/19/2019, 1:07 PM  [facility] [address] [address]Phone: [phone]  Fax:  [phone] Name: EEMERLYN MEHLHOFFMRN: [id number] of Birth: [birth year] PHYSICAL THERAPY DISCHARGE SUMMARY  Visits from Start of Care: Evaluation only   Current functional level related to goals / functional outcomes: See evaluation  Remaining deficits: Unknown    Education / Equipment: HEP  Plan: Patient agrees to discharge.  Patient goals were not met. Patient is being discharged due to not returning since the last visit.  ?????    Farhan Jean P. Helene Kelp PT, MPH 01/24/20 9:30 AM

## 2019-12-19 NOTE — Telephone Encounter (Signed)
I've been catching up since out of the office last week and this week. I'm working on it now (please remind patient of 7 day policy for paperwork).   I sent her a MyChart message - please call and ask her to reply ASAP so I can complete the forms accurately.   I need her to confirm why she is requesting FMLA (I think for the abdominal pain)   If yes, I need the following information:   1) confirm date condition started (first ER visit 12/09/2019 states symptoms ongoing for about a week, so starting approx 12/02/2019)  2) confirm there was NO inpatient stay in hospital   3) confirm that continuous leave is being requested, NOT reduced schedule or intermittent leave - and I need to know the dates you missed work (date you last went to work, date you returned to work)

## 2019-12-19 NOTE — Telephone Encounter (Signed)
Pt left a vm msg stating that her job is requesting her FMLA form. She wanted to know whether provider has completed her paperwork. Pls advise, thanks.

## 2019-12-20 NOTE — Telephone Encounter (Signed)
Attempted to contact pt regarding provider's note. No answer, left a vm msg. Aware to respond to provider via MyChart with necessary info required to complete FMLA form. Direct call back info provided.

## 2019-12-26 NOTE — Telephone Encounter (Signed)
Pt sent completed FMLA form via fax this afternoon. Placed in provider's box for review.

## 2020-07-23 ENCOUNTER — Other Ambulatory Visit (HOSPITAL_COMMUNITY)
Admission: RE | Admit: 2020-07-23 | Discharge: 2020-07-23 | Disposition: A | Discharge status: Home / Self Care | Payer: 59 | Source: Ambulatory Visit | Attending: Osteopathic Medicine | Admitting: Osteopathic Medicine

## 2020-07-23 ENCOUNTER — Ambulatory Visit (INDEPENDENT_AMBULATORY_CARE_PROVIDER_SITE_OTHER): Payer: 59 | Admitting: Osteopathic Medicine

## 2020-07-23 ENCOUNTER — Encounter: Payer: Self-pay | Admitting: Osteopathic Medicine

## 2020-07-23 VITALS — BP 126/86 | HR 60 | Temp 97.9°F | Wt 162.0 lb

## 2020-07-23 DIAGNOSIS — Z8632 Personal history of gestational diabetes: Secondary | ICD-10-CM

## 2020-07-23 DIAGNOSIS — R7303 Prediabetes: Secondary | ICD-10-CM

## 2020-07-23 DIAGNOSIS — Z1211 Encounter for screening for malignant neoplasm of colon: Secondary | ICD-10-CM

## 2020-07-23 DIAGNOSIS — Z7184 Encounter for health counseling related to travel: Secondary | ICD-10-CM

## 2020-07-23 DIAGNOSIS — Z124 Encounter for screening for malignant neoplasm of cervix: Secondary | ICD-10-CM | POA: Insufficient documentation

## 2020-07-23 DIAGNOSIS — Z0000 Encounter for general adult medical examination without abnormal findings: Secondary | ICD-10-CM

## 2020-07-23 MED ORDER — HYDROXYCHLOROQUINE SULFATE 200 MG PO TABS: ORAL_TABLET | ORAL | 0 refills | Status: DC

## 2020-07-23 MED ORDER — AZITHROMYCIN 500 MG PO TABS: 1000.0000 mg | ORAL_TABLET | Freq: Once | ORAL | 1 refills | Status: AC

## 2020-07-23 MED ORDER — TYPHOID VACCINE PO CPDR: DELAYED_RELEASE_CAPSULE | ORAL | 0 refills | Status: DC

## 2020-07-23 NOTE — Progress Notes (Signed)
HPI: Brianna Lowery is a 54 y.o. female who  has no past medical history on file.  she presents to [facility] today, 07/23/20,  for chief complaint of:  [place] - needs malaria ppx, typhoid vax, travelers diarrhea abx       ASSESSMENT/PLAN: The primary encounter diagnosis was Annual physical exam. Diagnoses of Prediabetes, History of gestational diabetes, Travel advice encounter, and Cervical cancer screening were also pertinent to this visit.   Orders Placed This Encounter  Procedures  . MM 3D SCREEN BREAST BILATERAL  . CBC  . COMPLETE METABOLIC PANEL WITH GFR  . Lipid panel  . Hemoglobin A1c  . HIV Antibody (routine testing w rflx)  . Hepatitis C antibody     Meds ordered this encounter  Medications  . hydroxychloroquine (PLAQUENIL) 200 MG tablet    Sig: Take 2 tablets (400 mg total) by mouth once a week. Start 1-2 weeks prior to going to [place], continue for 4 weeks after return to [place]    Dispense:  30 tablet    Refill:  0  . azithromycin (ZITHROMAX) 500 MG tablet    Sig: Take 2 tablets (1,000 mg total) by mouth once for 1 dose. As needed for traveler's diarrhea    Dispense:  2 tablet    Refill:  1  . typhoid (VIVOTIF) DR capsule    Sig: One capsule on alternate days (day 1, 3, 5, and 7) for a total of 4 doses; all doses should be completed at least 1 week prior to travel    Dispense:  4 capsule    Refill:  0    Patient Instructions  General Preventive Care  Most recent routine screening labs: ordered.   Blood pressure goal 130/80 or less.   Tobacco: don't!   Alcohol: responsible moderation is ok for most adults - if you have concerns about your alcohol intake, please talk to me!   Exercise: as tolerated to reduce risk of cardiovascular disease and diabetes. Strength training will also prevent osteoporosis.   Mental health: if need for mental health care (medicines, counseling, other), or  concerns about moods, please let me know!   Sexual / Reproductive health: if need for STD testing, or if concerns with libido/pain problems, please let me know!  Advanced Directive: Living Will and/or Healthcare Power of Attorney recommended for all adults, regardless of age or health.  Vaccines  Flu vaccine: for almost everyone, every fall.   Shingles vaccine: NONE ON FILE - recommended after age 68.   Pneumonia vaccines: after age 66.  Tetanus booster: NONE ON FILE - recommended every 10 years   COVID vaccine: THANKS for getting your vaccine! :) consider booster in 6-9 months after 2nd shot, ok to get [place] or [place] for booster  Cancer screenings   Colon cancer screening: NONE ON FILE - needed for everyone age 52-75. Colonoscopy available for all, many people also qualify for the Cologuard stool test   Breast cancer screening: mammogram annually age 54-75. Due by 08/2021, optional 08/2020  Cervical cancer screening: NONE ON FILE - Pap every 1 to 5 years depending on age and other risk factors. Can usually stop at age 27 or w/ hysterectomy.   Lung cancer screening: not needed for non-smokers  Infection screenings  . HIV: NONE ON FILE - recommended screening at least once age 38-65, more often as needed. . Gonorrhea/Chlamydia: screening as needed . Hepatitis C: NONE ON FILE - recommended  once for everyone age 1-75 . TB: certain at-risk populations, or depending on work requirements and/or travel history Other . Bone Density Test: recommended for women at age 6     Follow-up plan: Return in about 1 year (around 07/23/2021) for [employer].                                                 ################################################# ################################################# ################################################# #################################################    Current Meds  Medication Sig  .  dicyclomine (BENTYL) 20 MG tablet Take 1 tablet (20 mg total) by mouth 4 (four) times daily -  before meals and at bedtime.  . gabapentin (NEURONTIN) 300 MG capsule Take 1-2 capsules (300-600 mg total) by mouth 3 (three) times daily.  . methocarbamol (ROBAXIN) 500 MG tablet Take 1 tablet (500 mg total) by mouth 2 (two) times daily.  . naproxen (NAPROSYN) 500 MG tablet Take 1 tablet (500 mg total) by mouth 2 (two) times daily.  . ondansetron (ZOFRAN-ODT) 4 MG disintegrating tablet   . pantoprazole (PROTONIX) 40 MG tablet Take 1 tablet (40 mg total) by mouth daily.    No Known Allergies     Review of Systems: Pertinent (+) and (-) ROS in HPI as above   Exam:  BP 126/86 (BP Location: Left Arm, Patient Position: Sitting, Cuff Size: Large)   Pulse 60   Temp 97.9 F (36.6 C) (Oral)   Wt 162 lb 0.6 oz (73.5 kg)   LMP 10/05/2017   BMI 29.64 kg/m   Constitutional: VS see above. General Appearance: alert, well-developed, well-nourished, NAD  Neck: No masses, trachea midline.   Respiratory: Normal respiratory effort. no wheeze, no rhonchi, no rales  Cardiovascular: S1/S2 normal, no murmur, no rub/gallop auscultated. RRR.   Musculoskeletal: Gait normal. Symmetric and independent movement of all extremities  Abdominal: non-tender, non-distended, no appreciable organomegaly, neg Murphy's, BS WNLx4  Neurological: Normal balance/coordination. No tremor.  Skin: warm, dry, intact.  Psychiatric: Normal judgment/insight. Normal mood and affect. Oriented x3.  GYN: No lesions/ulcers to external genitalia, normal urethra, normal vaginal mucosa, physiologic discharge, cervix normal without lesions, uterus not enlarged or tender, adnexa no masses and nontender       Visit summary with medication list and pertinent instructions was printed for patient to review, patient was advised to alert [place] if any updates are needed. All questions at time of visit were answered - patient instructed to  contact office with any additional concerns. ER/RTC precautions were reviewed with the patient and understanding verbalized.   Please note: voice recognition software was used to produce this document, and typos may escape review. Please contact Dr. Sheppard Coil for any needed clarifications.    Follow up plan: Return in about 1 year (around 07/23/2021) for [employer].

## 2020-07-23 NOTE — Patient Instructions (Addendum)
General Preventive Care  Most recent routine screening labs: ordered.   Blood pressure goal 130/80 or less.   Tobacco: don't!   Alcohol: responsible moderation is ok for most adults - if you have concerns about your alcohol intake, please talk to me!   Exercise: as tolerated to reduce risk of cardiovascular disease and diabetes. Strength training will also prevent osteoporosis.   Mental health: if need for mental health care (medicines, counseling, other), or concerns about moods, please let me know!   Sexual / Reproductive health: if need for STD testing, or if concerns with libido/pain problems, please let me know!  Advanced Directive: Living Will and/or Healthcare Power of Attorney recommended for all adults, regardless of age or health.  Vaccines  Flu vaccine: for almost everyone, every fall.   Shingles vaccine: NONE ON FILE - recommended after age 26.   Pneumonia vaccines: after age 47.  Tetanus booster: NONE ON FILE - recommended every 10 years   COVID vaccine: THANKS for getting your vaccine! :) consider booster in 6-9 months after 2nd shot, ok to get [place] or [place] for booster  Cancer screenings   Colon cancer screening: NONE ON FILE - needed for everyone age 70-75. Colonoscopy available for all, many people also qualify for the Cologuard stool test   Breast cancer screening: mammogram annually age 41-75. Due by 08/2021, optional 08/2020  Cervical cancer screening: NONE ON FILE - Pap every 1 to 5 years depending on age and other risk factors. Can usually stop at age 57 or w/ hysterectomy.   Lung cancer screening: not needed for non-smokers  Infection screenings  . HIV: NONE ON FILE - recommended screening at least once age 78-65, more often as needed. . Gonorrhea/Chlamydia: screening as needed . Hepatitis C: NONE ON FILE - recommended once for everyone age 7-75 . TB: certain at-risk populations, or depending on work requirements and/or travel  history Other . Bone Density Test: recommended for women at age 83

## 2020-07-24 LAB — COMPLETE METABOLIC PANEL WITH GFR
AG Ratio: 1.4 (calc) (ref 1.0–2.5)
ALT: 20 U/L (ref 6–29)
AST: 21 U/L (ref 10–35)
Albumin: 4.5 g/dL (ref 3.6–5.1)
Alkaline phosphatase (APISO): 65 U/L (ref 37–153)
BUN: 13 mg/dL (ref 7–25)
CO2: 27 mmol/L (ref 20–32)
Calcium: 9.9 mg/dL (ref 8.6–10.4)
Chloride: 105 mmol/L (ref 98–110)
Creat: 0.86 mg/dL (ref 0.50–1.05)
GFR, Est African American: 91 mL/min/{1.73_m2} (ref >=60)
GFR, Est Non African American: 78 mL/min/{1.73_m2} (ref >=60)
Globulin: 3.2 g/dL (ref 1.9–3.7)
Glucose, Bld: 89 mg/dL (ref 65–99)
Potassium: 4.2 mmol/L (ref 3.5–5.3)
Sodium: 142 mmol/L (ref 135–146)
Total Bilirubin: 0.4 mg/dL (ref 0.2–1.2)
Total Protein: 7.7 g/dL (ref 6.1–8.1)

## 2020-07-24 LAB — HEPATITIS C ANTIBODY
Hepatitis C Ab: NONREACTIVE
SIGNAL TO CUT-OFF: 0.86 (ref <1.00)

## 2020-07-24 LAB — CBC
HCT: 44.4 % (ref 35.0–45.0)
Hemoglobin: 14.2 g/dL (ref 11.7–15.5)
MCH: 28.2 pg (ref 27.0–33.0)
MCHC: 32 g/dL (ref 32.0–36.0)
MCV: 88.3 fL (ref 80.0–100.0)
MPV: 12.1 fL (ref 7.5–12.5)
Platelets: 258 10*3/uL (ref 140–400)
RBC: 5.03 10*6/uL (ref 3.80–5.10)
RDW: 12.7 % (ref 11.0–15.0)
WBC: 4 10*3/uL (ref 3.8–10.8)

## 2020-07-24 LAB — LIPID PANEL
Cholesterol: 193 mg/dL (ref <200)
HDL: 47 mg/dL — ABNORMAL LOW (ref >=50)
LDL Cholesterol (Calc): 124 mg/dL — ABNORMAL HIGH
Non-HDL Cholesterol (Calc): 146 mg/dL — ABNORMAL HIGH (ref <130)
Total CHOL/HDL Ratio: 4.1 (calc) (ref <5.0)
Triglycerides: 113 mg/dL (ref <150)

## 2020-07-24 LAB — HEMOGLOBIN A1C
Hgb A1c MFr Bld: 6.4 %{Hb} — ABNORMAL HIGH (ref <5.7)
Mean Plasma Glucose: 137 (calc)
eAG (mmol/L): 7.6 (calc)

## 2020-07-24 LAB — HIV ANTIBODY (ROUTINE TESTING W REFLEX): HIV 1&2 Ab, 4th Generation: NONREACTIVE

## 2020-07-28 LAB — CYTOLOGY - PAP
Comment: NEGATIVE
Diagnosis: NEGATIVE
High risk HPV: NEGATIVE

## 2020-07-28 NOTE — Progress Notes (Signed)
Normal Pap, repeat 5 years

## 2020-08-07 ENCOUNTER — Emergency Department (HOSPITAL_COMMUNITY): Payer: 59

## 2020-08-07 ENCOUNTER — Other Ambulatory Visit: Payer: Self-pay

## 2020-08-07 ENCOUNTER — Encounter (HOSPITAL_COMMUNITY): Payer: Self-pay

## 2020-08-07 ENCOUNTER — Emergency Department (HOSPITAL_COMMUNITY)
Admission: EM | Admit: 2020-08-07 | Discharge: 2020-08-07 | Disposition: A | Discharge status: Home / Self Care | Payer: 59 | Attending: Emergency Medicine | Admitting: Emergency Medicine

## 2020-08-07 DIAGNOSIS — R252 Cramp and spasm: Secondary | ICD-10-CM | POA: Insufficient documentation

## 2020-08-07 DIAGNOSIS — R1013 Epigastric pain: Secondary | ICD-10-CM | POA: Diagnosis not present

## 2020-08-07 DIAGNOSIS — R202 Paresthesia of skin: Secondary | ICD-10-CM | POA: Diagnosis not present

## 2020-08-07 DIAGNOSIS — E119 Type 2 diabetes mellitus without complications: Secondary | ICD-10-CM | POA: Insufficient documentation

## 2020-08-07 DIAGNOSIS — Z79899 Other long term (current) drug therapy: Secondary | ICD-10-CM | POA: Insufficient documentation

## 2020-08-07 DIAGNOSIS — R0789 Other chest pain: Secondary | ICD-10-CM | POA: Diagnosis not present

## 2020-08-07 DIAGNOSIS — R079 Chest pain, unspecified: Secondary | ICD-10-CM | POA: Diagnosis not present

## 2020-08-07 DIAGNOSIS — R072 Precordial pain: Secondary | ICD-10-CM | POA: Diagnosis not present

## 2020-08-07 HISTORY — DX: Type 2 diabetes mellitus without complications: E11.9

## 2020-08-07 LAB — CBC
HCT: 41.8 % (ref 36.0–46.0)
Hemoglobin: 13.7 g/dL (ref 12.0–15.0)
MCH: 28.8 pg (ref 26.0–34.0)
MCHC: 32.8 g/dL (ref 30.0–36.0)
MCV: 87.8 fL (ref 80.0–100.0)
Platelets: 275 10*3/uL (ref 150–400)
RBC: 4.76 MIL/uL (ref 3.87–5.11)
RDW: 13.2 % (ref 11.5–15.5)
WBC: 5.1 10*3/uL (ref 4.0–10.5)
nRBC: 0 % (ref 0.0–0.2)

## 2020-08-07 LAB — BASIC METABOLIC PANEL
Anion gap: 8 (ref 5–15)
BUN: 14 mg/dL (ref 6–20)
CO2: 26 mmol/L (ref 22–32)
Calcium: 9.2 mg/dL (ref 8.9–10.3)
Chloride: 106 mmol/L (ref 98–111)
Creatinine, Ser: 1 mg/dL (ref 0.44–1.00)
GFR, Estimated: >60 mL/min (ref >60)
Glucose, Bld: 100 mg/dL — ABNORMAL HIGH (ref 70–99)
Potassium: 4.6 mmol/L (ref 3.5–5.1)
Sodium: 140 mmol/L (ref 135–145)

## 2020-08-07 LAB — TROPONIN I (HIGH SENSITIVITY)
Troponin I (High Sensitivity): 4 ng/L (ref <18)
Troponin I (High Sensitivity): 7 ng/L (ref <18)

## 2020-08-07 NOTE — ED Triage Notes (Signed)
Patient c/o left chest pain, numbness and tingling of her left arm and left leg approx 1 hour ago. Patient denies any SOB or diaphoresis.

## 2020-08-07 NOTE — ED Provider Notes (Signed)
[facility] DEPT Provider Note   CSN: [id number] Arrival date & time: 08/07/20  1218     History Chief Complaint  Patient presents with  . Chest Pain    Brianna Lowery is a 54 y.o. female.  54 year old female with prior medical history as detailed below presents for evaluation of reported chest discomfort.  Patient reports that she was at work.  She developed substernal sharp chest discomfort.  This lasted approximately 30 minutes.  Pain is resolved now.  She denies associated shortness of breath or diaphoresis.  She does report feeling tingling in her fingertips and in her feet.  She also reports "muscular cramps" in her feet.  The tingling and cramps are also now resolved.  Patient denies known prior history of CAD or other cardiac pathology.  The history is provided by the patient and medical records.  Chest Pain Pain location:  Substernal area and epigastric Pain quality: aching and sharp   Pain radiates to:  Does not radiate Pain severity:  Mild Onset quality:  Sudden Duration:  1 hour Timing:  Constant Progression:  Resolved Chronicity:  New Relieved by:  Nothing Worsened by:  Nothing      Past Medical History:  Diagnosis Date  . Diabetes mellitus without complication [facility])    gestational    Patient Active Problem List   Diagnosis Date Noted  . 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 Prediabetes 11/22/2017  . Deafness in left ear 11/22/2017  . History of gestational diabetes 10/12/2017    Past Surgical History:  Procedure Laterality Date  . CESAREAN SECTION    . ESSURE TUBAL LIGATION  2014     OB History   No obstetric history on file.     Family History  Family history unknown: Yes    Social History   Tobacco Use  . Smoking status: Never Smoker  . Smokeless tobacco: Never Used  Vaping Use  . Vaping Use: Never used  Substance Use Topics  . Alcohol use: No  . Drug use: No     Home Medications Prior to Admission medications   Medication Sig Start Date End Date Taking? Authorizing Provider  dicyclomine (BENTYL) 20 MG tablet Take 1 tablet (20 mg total) by mouth 4 (four) times daily -  before meals and at bedtime. 08/23/19   Emeterio Reeve, DO  gabapentin (NEURONTIN) 300 MG capsule Take 1-2 capsules (300-600 mg total) by mouth 3 (three) times daily. 11/28/19   Emeterio Reeve, DO  hydroxychloroquine (PLAQUENIL) 200 MG tablet Take 2 tablets (400 mg total) by mouth once a week. Start 1-2 weeks prior to going to [place], continue for 4 weeks after return to [place] 07/23/20   Emeterio Reeve, DO  methocarbamol (ROBAXIN) 500 MG tablet Take 1 tablet (500 mg total) by mouth 2 (two) times daily. 12/09/19   Suzy Bouchard, PA-C  naproxen (NAPROSYN) 500 MG tablet Take 1 tablet (500 mg total) by mouth 2 (two) times daily. 12/09/19   Suzy Bouchard, PA-C  ondansetron (ZOFRAN-ODT) 4 MG disintegrating tablet  08/12/19   [provider]  pantoprazole (PROTONIX) 40 MG tablet Take 1 tablet (40 mg total) by mouth daily. 08/23/19   Emeterio Reeve, DO  typhoid (VIVOTIF) DR capsule One capsule on alternate days (day 1, 3, 5, and 7) for a total of 4 doses; all doses should be completed at least 1 week prior to travel 07/23/20   Emeterio Reeve, DO    Allergies  Patient has no known allergies.  Review of Systems   Review of Systems  Cardiovascular: Positive for chest pain.  All other systems reviewed and are negative.   Physical Exam Updated Vital Signs BP 116/73   Pulse 66   Temp 98.8 F (37.1 C) (Oral)   Resp (!) 21   Ht 5\' 2"  (1.575 m)   Wt 73.5 kg   LMP 10/05/2017   SpO2 100%   BMI 29.63 kg/m   Physical Exam Vitals and nursing note reviewed.  Constitutional:      General: She is not in acute distress.    Appearance: She is well-developed.  HENT:     Head: Normocephalic and atraumatic.  Eyes:     Conjunctiva/sclera: Conjunctivae  normal.     Pupils: Pupils are equal, round, and reactive to light.  Cardiovascular:     Rate and Rhythm: Normal rate and regular rhythm.     Heart sounds: Normal heart sounds.  Pulmonary:     Effort: Pulmonary effort is normal. No respiratory distress.     Breath sounds: Normal breath sounds.  Abdominal:     General: There is no distension.     Palpations: Abdomen is soft.     Tenderness: There is no abdominal tenderness.  Musculoskeletal:        General: No deformity. Normal range of motion.     Cervical back: Normal range of motion and neck supple.  Skin:    General: Skin is warm and dry.  Neurological:     General: No focal deficit present.     Mental Status: She is alert and oriented to person, place, and time.     Cranial Nerves: No cranial nerve deficit.     Motor: No weakness.     ED Results / Procedures / Treatments   Labs (all labs ordered are listed, but only abnormal results are displayed) Labs Reviewed  BASIC METABOLIC PANEL - Abnormal; Notable for the following components:      Result Value   Glucose, Bld 100 (*)    All other components within normal limits  CBC  TROPONIN I (HIGH SENSITIVITY)  TROPONIN I (HIGH SENSITIVITY)    EKG EKG Interpretation  Date/Time:  Thursday August 07 2020 12:28:22 EST Ventricular Rate:  68 PR Interval:    QRS Duration: 84 QT Interval:  385 QTC Calculation: 410 R Axis:   74 Text Interpretation: Sinus rhythm Borderline T abnormalities, anterior leads 12 Lead; [place] Confirmed by Dene Gentry [phone]) on 08/07/2020 1:09:47 PM   Radiology DG Chest 2 View  Result Date: 08/07/2020 CLINICAL DATA:  Chest pain EXAM: CHEST - 2 VIEW COMPARISON:  None. FINDINGS: The heart size and mediastinal contours are within normal limits. Both lungs are clear. No pleural effusion or pneumothorax. The visualized skeletal structures are unremarkable. IMPRESSION: No acute process in the chest. Electronically Signed   By: Macy Mis  M.D.   On: 08/07/2020 13:42    Procedures Procedures (including critical care time)  Medications Ordered in ED Medications - No data to display  ED Course  I have reviewed the triage vital signs and the nursing notes.  Pertinent labs & imaging results that were available during my care of the patient were reviewed by me and considered in my medical decision making (see chart for details).    MDM Rules/Calculators/A&P                          MDM  Screen complete  Brianna Lowery was evaluated in Emergency Department on 08/07/2020 for the symptoms described in the history of present illness. She was evaluated in the context of the global COVID-19 pandemic, which necessitated consideration that the patient might be at risk for infection with the SARS-CoV-2 virus that causes COVID-19. Institutional protocols and algorithms that pertain to the evaluation of patients at risk for COVID-19 are in a state of rapid change based on information released by regulatory bodies including the CDC and federal and state organizations. These policies and algorithms were followed during the patient's care in the ED.  Presented for evaluation of reported chest discomfort.  Patient describes symptoms are atypical in nature.  Patient's work-up today is without significant abnormality.  EKG is without acute ischemia.  Troponin x2 is nearly undetectable with minimal delta.  Patient feels improved following her ED evaluation.  She desires discharge.  She declines further observation and/or admission for her symptoms.  Importance of close follow-up is stressed.  Strict return precautions given and understood.   Final Clinical Impression(s) / ED Diagnoses Final diagnoses:  Atypical chest pain    Rx / DC Orders ED Discharge Orders    None       Valarie Merino, MD 08/07/20 1549

## 2020-08-07 NOTE — Discharge Instructions (Signed)
Please return for any problem.  °

## 2020-09-01 ENCOUNTER — Encounter: Payer: Self-pay | Admitting: Gastroenterology

## 2020-10-22 ENCOUNTER — Ambulatory Visit: Payer: 59 | Admitting: *Deleted

## 2020-10-22 ENCOUNTER — Other Ambulatory Visit: Payer: Self-pay

## 2020-10-22 ENCOUNTER — Other Ambulatory Visit: Payer: Self-pay | Admitting: Gastroenterology

## 2020-10-22 VITALS — Ht 62.0 in | Wt 160.0 lb

## 2020-10-22 DIAGNOSIS — Z1211 Encounter for screening for malignant neoplasm of colon: Secondary | ICD-10-CM

## 2020-10-22 MED ORDER — NA SULFATE-K SULFATE-MG SULF 17.5-3.13-1.6 GM/177ML PO SOLN: 1.0000 | Freq: Once | ORAL | 0 refills | Status: DC

## 2020-10-22 MED FILL — SUPREP BOWEL PREP KIT: 17.5-3.13-1 | 1 days supply | Qty: 354 | Fill #0

## 2020-10-22 NOTE — Progress Notes (Signed)
No egg or soy allergy known to patient  No issues with past sedation with any surgeries or procedures No intubation problems in the past  No FH of Malignant Hyperthermia No diet pills per patient No home 02 use per patient  No blood thinners per patient  Pt denies issues with constipation  No A fib or A flutter  EMMI video to pt or via Murphy 19 guidelines implemented in PV today with Pt and RN  Pt is fully vaccinated  for Covid   Coupon given to pt in PV today , Code to Pharmacy and  NO PA's for preps discussed with pt  In PV today   Due to the COVID-19 pandemic we are asking patients to follow certain guidelines.  Pt aware of COVID protocols and [facility] guidelines

## 2020-11-05 ENCOUNTER — Ambulatory Visit (AMBULATORY_SURGERY_CENTER): Payer: 59 | Admitting: Gastroenterology

## 2020-11-05 ENCOUNTER — Other Ambulatory Visit: Payer: Self-pay

## 2020-11-05 ENCOUNTER — Encounter: Payer: Self-pay | Admitting: Gastroenterology

## 2020-11-05 VITALS — BP 106/58 | HR 55 | Temp 97.3°F | Resp 12 | Ht 62.0 in | Wt 160.0 lb

## 2020-11-05 DIAGNOSIS — D122 Benign neoplasm of ascending colon: Secondary | ICD-10-CM

## 2020-11-05 DIAGNOSIS — K640 First degree hemorrhoids: Secondary | ICD-10-CM

## 2020-11-05 DIAGNOSIS — Z1211 Encounter for screening for malignant neoplasm of colon: Secondary | ICD-10-CM

## 2020-11-05 MED ORDER — SODIUM CHLORIDE 0.9 % IV SOLN: 500.0000 mL | Freq: Once | INTRAVENOUS | Status: DC

## 2020-11-05 NOTE — Patient Instructions (Signed)
Handouts on polyps and hemorrhoids provided. Away pathology results.   YOU HAD AN ENDOSCOPIC PROCEDURE TODAY AT [facility]:   Refer to the procedure report that was given to you for any specific questions about what was found during the examination.  If the procedure report does not answer your questions, please call your gastroenterologist to clarify.  If you requested that your care partner not be given the details of your procedure findings, then the procedure report has been included in a sealed envelope for you to review at your convenience later.  YOU SHOULD EXPECT: Some feelings of bloating in the abdomen. Passage of more gas than usual.  Walking can help get rid of the air that was put into your GI tract during the procedure and reduce the bloating. If you had a lower endoscopy (such as a colonoscopy or flexible sigmoidoscopy) you may notice spotting of blood in your stool or on the toilet paper. If you underwent a bowel prep for your procedure, you may not have a normal bowel movement for a few days.  Please Note:  You might notice some irritation and congestion in your nose or some drainage.  This is from the oxygen used during your procedure.  There is no need for concern and it should clear up in a day or so.  SYMPTOMS TO REPORT IMMEDIATELY:   Following lower endoscopy (colonoscopy or flexible sigmoidoscopy):  Excessive amounts of blood in the stool  Significant tenderness or worsening of abdominal pains  Swelling of the abdomen that is new, acute  Fever of 100F or higher   For urgent or emergent issues, a gastroenterologist can be reached at any hour by calling [phone]. Do not use MyChart messaging for urgent concerns.    DIET:  We do recommend a small meal at first, but then you may proceed to your regular diet.  Drink plenty of fluids but you should avoid alcoholic beverages for 24 hours.  ACTIVITY:  You should plan to take it easy for the rest of  today and you should NOT DRIVE or use heavy machinery until tomorrow (because of the sedation medicines used during the test).    FOLLOW UP: Our staff will call the number listed on your records 48-72 hours following your procedure to check on you and address any questions or concerns that you may have regarding the information given to you following your procedure. If we do not reach you, we will leave a message.  We will attempt to reach you two times.  During this call, we will ask if you have developed any symptoms of COVID 19. If you develop any symptoms (ie: fever, flu-like symptoms, shortness of breath, cough etc.) before then, please call [phone].  If you test positive for Covid 19 in the 2 weeks post procedure, please call and report this information to [place].    If any biopsies were taken you will be contacted by phone or by letter within the next 1-3 weeks.  Please call us at [phone] if you have not heard about the biopsies in 3 weeks.    SIGNATURES/CONFIDENTIALITY: You and/or your care partner have signed paperwork which will be entered into your electronic medical record.  These signatures attest to the fact that that the information above on your After Visit Summary has been reviewed and is understood.  Full responsibility of the confidentiality of this discharge information lies with you and/or your care-partner.

## 2020-11-05 NOTE — Op Note (Signed)
[facility] Patient Name: Brianna Lowery Procedure Date: 11/05/2020 9:28 AM MRN: [id number] Endoscopist: Gerrit Heck , MD Age: 55 Referring MD:  Date of Birth: November 26, 1965 Gender: Female Account #: [id number] Procedure:                Colonoscopy Indications:              Screening for colorectal malignant neoplasm, This                            is the patient's first colonoscopy Medicines:                Monitored Anesthesia Care Procedure:                Pre-Anesthesia Assessment:                           - Prior to the procedure, a History and Physical                            was performed, and patient medications and                            allergies were reviewed. The patient's tolerance of                            previous anesthesia was also reviewed. The risks                            and benefits of the procedure and the sedation                            options and risks were discussed with the patient.                            All questions were answered, and informed consent                            was obtained. Prior Anticoagulants: The patient has                            taken no previous anticoagulant or antiplatelet                            agents. ASA Grade Assessment: II - A patient with                            mild systemic disease. After reviewing the risks                            and benefits, the patient was deemed in                            satisfactory condition to undergo the procedure.  After obtaining informed consent, the colonoscope                            was passed under direct vision. Throughout the                            procedure, the patient's blood pressure, pulse, and                            oxygen saturations were monitored continuously. The                            Olympus CF-[id number] (#[id number]) Colonoscope was                            introduced through the  anus and advanced to the the                            terminal ileum. The colonoscopy was performed                            without difficulty. The patient tolerated the                            procedure well. The quality of the bowel                            preparation was good. The terminal ileum, ileocecal                            valve, appendiceal orifice, and rectum were                            photographed. Scope In: 9:34:38 AM Scope Out: 9:48:32 AM Scope Withdrawal Time: 0 hours 11 minutes 2 seconds  Total Procedure Duration: 0 hours 13 minutes 54 seconds  Findings:                 The perianal and digital rectal examinations were                            normal.                           A 3 mm polyp was found in the ascending colon. The                            polyp was sessile. The polyp was removed with a                            cold snare. Resection and retrieval were complete.                            Estimated blood loss was minimal.  The exam was otherwise normal throughout the                            remainder of the colon.                           Non-bleeding internal hemorrhoids were found during                            retroflexion. The hemorrhoids were small.                           The terminal ileum appeared normal. Complications:            No immediate complications. Estimated Blood Loss:     Estimated blood loss was minimal. Impression:               - One 3 mm polyp in the ascending colon, removed                            with a cold snare. Resected and retrieved.                           - Non-bleeding internal hemorrhoids.                           - The examined portion of the ileum was normal. Recommendation:           - Patient has a contact number available for                            emergencies. The signs and symptoms of potential                            delayed complications were  discussed with the                            patient. Return to normal activities tomorrow.                            Written discharge instructions were provided to the                            patient.                           - Resume previous diet.                           - Continue present medications.                           - Await pathology results.                           - Repeat colonoscopy for surveillance based on  pathology results.                           - Return to GI office PRN.                           - Use fiber, for example Citrucel, Fibercon, Konsyl                            or Metamucil.                           - Internal hemorrhoids were noted on this study and                            may be amenable to hemorrhoid band ligation. If you                            are interested in further treatment of these                            hemorrhoids with band ligation, please contact my                            clinic to set up an appointment for evaluation and                            treatment. Gerrit Heck, MD 11/05/2020 9:52:53 AM

## 2020-11-05 NOTE — Progress Notes (Signed)
To PACU, VSS. Report to [email or web address] 

## 2020-11-05 NOTE — Progress Notes (Signed)
Pt's states no medical or surgical changes since previsit or office visit.  AW Iv and CW vitals.

## 2020-11-07 ENCOUNTER — Telehealth: Payer: Self-pay

## 2020-11-07 ENCOUNTER — Telehealth: Payer: Self-pay | Admitting: *Deleted

## 2020-11-07 NOTE — Telephone Encounter (Signed)
First post procedure follow up call, no answer 

## 2020-11-07 NOTE — Telephone Encounter (Signed)
No answer for post procedure call back. Left VM. 

## 2020-11-10 ENCOUNTER — Encounter: Payer: Self-pay | Admitting: Gastroenterology

## 2021-01-23 ENCOUNTER — Other Ambulatory Visit: Payer: 59

## 2021-05-20 ENCOUNTER — Other Ambulatory Visit (HOSPITAL_COMMUNITY): Payer: Self-pay

## 2021-05-20 MED ORDER — CARESTART COVID-19 HOME TEST VI KIT
PACK | 0 refills | Status: DC
Start: 2021-05-20 — End: 2022-03-31
  Filled 2021-05-20: qty 4, 4d supply, fill #0

## 2021-07-29 ENCOUNTER — Encounter: Payer: 59 | Admitting: Osteopathic Medicine

## 2022-03-31 ENCOUNTER — Encounter: Payer: Self-pay | Admitting: Emergency Medicine

## 2022-03-31 ENCOUNTER — Ambulatory Visit: Payer: 59 | Admitting: Emergency Medicine

## 2022-03-31 VITALS — BP 102/70 | HR 65 | Temp 98.5°F | Ht 62.0 in | Wt 162.2 lb

## 2022-03-31 DIAGNOSIS — Z13220 Encounter for screening for lipoid disorders: Secondary | ICD-10-CM | POA: Diagnosis not present

## 2022-03-31 DIAGNOSIS — Z13228 Encounter for screening for other metabolic disorders: Secondary | ICD-10-CM

## 2022-03-31 DIAGNOSIS — Z130 Encounter for screening for diseases of the blood and blood-forming organs and certain disorders involving the immune mechanism: Secondary | ICD-10-CM | POA: Diagnosis not present

## 2022-03-31 DIAGNOSIS — Z0000 Encounter for general adult medical examination without abnormal findings: Secondary | ICD-10-CM

## 2022-03-31 DIAGNOSIS — Z1231 Encounter for screening mammogram for malignant neoplasm of breast: Secondary | ICD-10-CM

## 2022-03-31 DIAGNOSIS — Z1329 Encounter for screening for other suspected endocrine disorder: Secondary | ICD-10-CM | POA: Diagnosis not present

## 2022-03-31 DIAGNOSIS — Z7689 Persons encountering health services in other specified circumstances: Secondary | ICD-10-CM

## 2022-03-31 DIAGNOSIS — Z23 Encounter for immunization: Secondary | ICD-10-CM | POA: Diagnosis not present

## 2022-03-31 NOTE — Addendum Note (Signed)
Addended by: Rae Mar on: 03/31/2022 03:05 PM   Modules accepted: Orders

## 2022-03-31 NOTE — Progress Notes (Signed)
Brianna Lowery 56 y.o.   Chief Complaint  Patient presents with   New Patient (Initial Visit)    No concerns    HISTORY OF PRESENT ILLNESS: This is a 56 y.o. female first visit to this office, here to establish care with me. Healthy female with a healthy lifestyle.  Non-smoker. No chronic medical problems.  No chronic medications. Has no complaints or medical concerns today.  HPI   Prior to Admission medications   Medication Sig Start Date End Date Taking? Authorizing Provider  vitamin B-12 (CYANOCOBALAMIN) 100 MCG tablet Take 100 mcg by mouth daily.   Yes [provider]  vitamin C (ASCORBIC ACID) 250 MG tablet Take 250 mg by mouth daily.   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   Prediabetes 11/22/2017   Deafness in left ear 11/22/2017   History of gestational diabetes 10/12/2017    Past Medical History:  Diagnosis Date   Diabetes mellitus without complication ([place])    gestational    Past Surgical History:  Procedure Laterality Date   CESAREAN SECTION     ESSURE TUBAL LIGATION  2014    Social History   Socioeconomic History   Marital status: Married    Spouse name: Adebayo Hanway   Number of children: 3   Years of education: Not on file   Highest education level: Bachelor's degree (e.g., BA, AB, BS)  Occupational History    Employer: [employer]  Tobacco Use   Smoking status: Never   Smokeless tobacco: Never  Vaping Use   Vaping Use: Never used  Substance and Sexual Activity   Alcohol use: No   Drug use: No   Sexual activity: Yes    Partners: Female    Birth control/protection: None  Other Topics Concern   Not on file  Social History Narrative   Not on file   Social Determinants of Health   Financial Resource Strain: Not on file  Food Insecurity: Not on file  Transportation Needs: Not on file   Physical Activity: Unknown (07/18/2019)   Exercise Vital Sign    Days of Exercise per Week: 0 days    Minutes of Exercise per Session: Not on file  Stress: Not on file  Social Connections: Not on file  Intimate Partner Violence: Not on file    Family History  Problem Relation Age of Onset   Colon cancer Neg Hx    Colon polyps Neg Hx    Esophageal cancer Neg Hx    Rectal cancer Neg Hx    Stomach cancer Neg Hx      Review of Systems  Constitutional: Negative.  Negative for chills and fever.  HENT: Negative.  Negative for congestion and sore throat.   Respiratory: Negative.  Negative for cough and shortness of breath.   Cardiovascular: Negative.  Negative for chest pain and palpitations.  Gastrointestinal:  Negative for abdominal pain, diarrhea, nausea and vomiting.  Genitourinary: Negative.   Skin: Negative.  Negative for rash.  Neurological:  Negative for dizziness and headaches.  All other systems reviewed and are negative.  Today's Vitals   03/31/22 1328  BP: 102/70  Pulse: 65  Temp: 98.5 F (36.9 C)  TempSrc: Oral  SpO2: 94%  Weight: 162 lb 4 oz (73.6 kg)  Height: '5\' 2"'$  (1.575 m)   Body mass index is 29.68 kg/m.  Physical Exam Vitals reviewed.  Constitutional:      Appearance: Normal appearance.  HENT:     Head: Normocephalic and atraumatic.     Right Ear: Tympanic membrane, ear canal and external ear normal.     Left Ear: Tympanic membrane, ear canal and external ear normal.     Mouth/Throat:     Mouth: Mucous membranes are moist.     Pharynx: Oropharynx is clear.  Eyes:     Extraocular Movements: Extraocular movements intact.     Conjunctiva/sclera: Conjunctivae normal.     Pupils: Pupils are equal, round, and reactive to light.  Cardiovascular:     Rate and Rhythm: Normal rate and regular rhythm.     Pulses: Normal pulses.     Heart sounds: Normal heart sounds.  Pulmonary:     Effort: Pulmonary effort is normal.     Breath sounds: Normal  breath sounds.  Abdominal:     General: Bowel sounds are normal. There is no distension.     Palpations: Abdomen is soft.     Tenderness: There is no abdominal tenderness.  Musculoskeletal:        General: Normal range of motion.     Cervical back: No tenderness.     Right lower leg: No edema.     Left lower leg: No edema.  Lymphadenopathy:     Cervical: No cervical adenopathy.  Skin:    General: Skin is warm and dry.     Capillary Refill: Capillary refill takes less than 2 seconds.  Neurological:     General: No focal deficit present.     Mental Status: She is alert and oriented to person, place, and time.  Psychiatric:        Mood and Affect: Mood normal.        Behavior: Behavior normal.      ASSESSMENT & PLAN: Problem List Items Addressed This Visit   None Visit Diagnoses     Routine general medical examination at a health care facility    -  Primary   Need for vaccination       Relevant Orders   Tdap vaccine greater than or equal to 7yo IM   Visit for screening mammogram       Relevant Orders   Mammogram Digital Screening   Screening for deficiency anemia       Relevant Orders   CBC with Differential   Screening for lipoid disorders       Relevant Orders   Lipid panel   Screening for endocrine, metabolic and immunity disorder       Relevant Orders   Comprehensive metabolic panel   Hemoglobin A1c   Encounter to establish care          Patient Instructions  Health Maintenance, Female Adopting a healthy lifestyle and getting preventive care are important in promoting health and wellness. Ask your health care provider about: The right schedule for you to have regular tests and exams. Things you can do on your own to prevent diseases and keep yourself healthy. What should I know about diet, weight, and exercise? Eat a healthy diet  Eat a diet that includes plenty of vegetables, fruits, low-fat dairy products, and lean protein. Do not eat a lot of foods that  are high in solid fats, added sugars, or sodium. Maintain a healthy weight Body mass index (BMI) is used to identify weight problems. It estimates body fat based on height and weight. Your health care provider can  help determine your BMI and help you achieve or maintain a healthy weight. Get regular exercise Get regular exercise. This is one of the most important things you can do for your health. Most adults should: Exercise for at least 150 minutes each week. The exercise should increase your heart rate and make you sweat (moderate-intensity exercise). Do strengthening exercises at least twice a week. This is in addition to the moderate-intensity exercise. Spend less time sitting. Even light physical activity can be beneficial. Watch cholesterol and blood lipids Have your blood tested for lipids and cholesterol at 53 years of age, then have this test every 5 years. Have your cholesterol levels checked more often if: Your lipid or cholesterol levels are high. You are older than 54 years of age. You are at high risk for heart disease. What should I know about cancer screening? Depending on your health history and family history, you may need to have cancer screening at various ages. This may include screening for: Breast cancer. Cervical cancer. Colorectal cancer. Skin cancer. Lung cancer. What should I know about heart disease, diabetes, and high blood pressure? Blood pressure and heart disease High blood pressure causes heart disease and increases the risk of stroke. This is more likely to develop in people who have high blood pressure readings or are overweight. Have your blood pressure checked: Every 3-5 years if you are 59-72 years of age. Every year if you are 86 years old or older. Diabetes Have regular diabetes screenings. This checks your fasting blood sugar level. Have the screening done: Once every three years after age 50 if you are at a normal weight and have a low risk for  diabetes. More often and at a younger age if you are overweight or have a high risk for diabetes. What should I know about preventing infection? Hepatitis B If you have a higher risk for hepatitis B, you should be screened for this virus. Talk with your health care provider to find out if you are at risk for hepatitis B infection. Hepatitis C Testing is recommended for: Everyone born from [birth year] through 1963. Anyone with known risk factors for hepatitis C. Sexually transmitted infections (STIs) Get screened for STIs, including gonorrhea and chlamydia, if: You are sexually active and are younger than 54 years of age. You are older than 54 years of age and your health care provider tells you that you are at risk for this type of infection. Your sexual activity has changed since you were last screened, and you are at increased risk for chlamydia or gonorrhea. Ask your health care provider if you are at risk. Ask your health care provider about whether you are at high risk for HIV. Your health care provider may recommend a prescription medicine to help prevent HIV infection. If you choose to take medicine to prevent HIV, you should first get tested for HIV. You should then be tested every 3 months for as long as you are taking the medicine. Pregnancy If you are about to stop having your period (premenopausal) and you may become pregnant, seek counseling before you get pregnant. Take 400 to 800 micrograms (mcg) of folic acid every day if you become pregnant. Ask for birth control (contraception) if you want to prevent pregnancy. Osteoporosis and menopause Osteoporosis is a disease in which the bones lose minerals and strength with aging. This can result in bone fractures. If you are 46 years old or older, or if you are at risk for osteoporosis and fractures,  ask your health care provider if you should: Be screened for bone loss. Take a calcium or vitamin D supplement to lower your risk of  fractures. Be given hormone replacement therapy (HRT) to treat symptoms of menopause. Follow these instructions at home: Alcohol use Do not drink alcohol if: Your health care provider tells you not to drink. You are pregnant, may be pregnant, or are planning to become pregnant. If you drink alcohol: Limit how much you have to: 0-1 drink a day. Know how much alcohol is in your drink. In the [place] one drink equals one 12 oz bottle of beer (355 mL), one 5 oz glass of wine (148 mL), or one 1 oz glass of hard liquor (44 mL). Lifestyle Do not use any products that contain nicotine or tobacco. These products include cigarettes, chewing tobacco, and vaping devices, such as e-cigarettes. If you need help quitting, ask your health care provider. Do not use street drugs. Do not share needles. Ask your health care provider for help if you need support or information about quitting drugs. General instructions Schedule regular health, dental, and eye exams. Stay current with your vaccines. Tell your health care provider if: You often feel depressed. You have ever been abused or do not feel safe at home. Summary Adopting a healthy lifestyle and getting preventive care are important in promoting health and wellness. Follow your health care provider's instructions about healthy diet, exercising, and getting tested or screened for diseases. Follow your health care provider's instructions on monitoring your cholesterol and blood pressure. This information is not intended to replace advice given to you by your health care provider. Make sure you discuss any questions you have with your health care provider. Document Revised: 01/26/2021 Document Reviewed: 01/26/2021 Elsevier Patient Education  [place], MD [facility]

## 2022-03-31 NOTE — Patient Instructions (Signed)

## 2022-04-01 ENCOUNTER — Other Ambulatory Visit: Payer: Self-pay | Admitting: Physician Assistant

## 2022-04-05 ENCOUNTER — Other Ambulatory Visit: Payer: Self-pay | Admitting: Emergency Medicine

## 2022-04-05 DIAGNOSIS — Z13228 Encounter for screening for other metabolic disorders: Secondary | ICD-10-CM | POA: Diagnosis not present

## 2022-04-05 DIAGNOSIS — Z13220 Encounter for screening for lipoid disorders: Secondary | ICD-10-CM | POA: Diagnosis not present

## 2022-04-05 DIAGNOSIS — Z130 Encounter for screening for diseases of the blood and blood-forming organs and certain disorders involving the immune mechanism: Secondary | ICD-10-CM | POA: Diagnosis not present

## 2022-04-05 DIAGNOSIS — Z1329 Encounter for screening for other suspected endocrine disorder: Secondary | ICD-10-CM | POA: Diagnosis not present

## 2022-04-06 LAB — COMPREHENSIVE METABOLIC PANEL
ALT: 22 [IU]/L (ref 0–32)
AST: 22 [IU]/L (ref 0–40)
Albumin/Globulin Ratio: 1.5 (ref 1.2–2.2)
Albumin: 4.5 g/dL (ref 3.8–4.9)
Alkaline Phosphatase: 84 [IU]/L (ref 44–121)
BUN/Creatinine Ratio: 10 (ref 9–23)
BUN: 10 mg/dL (ref 6–24)
Bilirubin Total: <0.2 mg/dL (ref 0.0–1.2)
CO2: 24 mmol/L (ref 20–29)
Calcium: 9.6 mg/dL (ref 8.7–10.2)
Chloride: 104 mmol/L (ref 96–106)
Creatinine, Ser: 0.96 mg/dL (ref 0.57–1.00)
Globulin, Total: 3.1 g/dL (ref 1.5–4.5)
Glucose: 122 mg/dL — ABNORMAL HIGH (ref 70–99)
Potassium: 4.4 mmol/L (ref 3.5–5.2)
Sodium: 141 mmol/L (ref 134–144)
Total Protein: 7.6 g/dL (ref 6.0–8.5)
eGFR: 71 mL/min/{1.73_m2} (ref >59)

## 2022-04-06 LAB — HEMOGLOBIN A1C
Est. average glucose Bld gHb Est-mCnc: 137 mg/dL
Hgb A1c MFr Bld: 6.4 % — ABNORMAL HIGH (ref 4.8–5.6)

## 2022-04-06 LAB — CBC WITH DIFFERENTIAL
Basophils Absolute: 0 10*3/uL (ref 0.0–0.2)
Basos: 1 %
EOS (ABSOLUTE): 0.1 10*3/uL (ref 0.0–0.4)
Eos: 2 %
Hematocrit: 41.8 % (ref 34.0–46.6)
Hemoglobin: 13.8 g/dL (ref 11.1–15.9)
Immature Grans (Abs): 0 10*3/uL (ref 0.0–0.1)
Immature Granulocytes: 0 %
Lymphocytes Absolute: 1.9 10*3/uL (ref 0.7–3.1)
Lymphs: 47 %
MCH: 28.6 pg (ref 26.6–33.0)
MCHC: 33 g/dL (ref 31.5–35.7)
MCV: 87 fL (ref 79–97)
Monocytes Absolute: 0.3 10*3/uL (ref 0.1–0.9)
Monocytes: 8 %
Neutrophils Absolute: 1.7 10*3/uL (ref 1.4–7.0)
Neutrophils: 42 %
RBC: 4.82 x10E6/uL (ref 3.77–5.28)
RDW: 13.1 % (ref 11.7–15.4)
WBC: 4 10*3/uL (ref 3.4–10.8)

## 2022-04-06 LAB — LIPID PANEL
Chol/HDL Ratio: 4.3 {ratio} (ref 0.0–4.4)
Cholesterol, Total: 203 mg/dL — ABNORMAL HIGH (ref 100–199)
HDL: 47 mg/dL (ref >39)
LDL Chol Calc (NIH): 138 mg/dL — ABNORMAL HIGH (ref 0–99)
Triglycerides: 97 mg/dL (ref 0–149)
VLDL Cholesterol Cal: 18 mg/dL (ref 5–40)

## 2022-04-14 ENCOUNTER — Ambulatory Visit
Admission: RE | Admit: 2022-04-14 | Discharge: 2022-04-14 | Disposition: A | Discharge status: Home / Self Care | Payer: 59 | Source: Ambulatory Visit | Attending: Emergency Medicine | Admitting: Emergency Medicine

## 2022-04-14 DIAGNOSIS — Z1231 Encounter for screening mammogram for malignant neoplasm of breast: Secondary | ICD-10-CM | POA: Diagnosis not present

## 2022-04-16 ENCOUNTER — Telehealth: Payer: Self-pay | Admitting: Emergency Medicine

## 2022-04-16 NOTE — Telephone Encounter (Signed)
Called patient to inform her of her lab results

## 2022-04-16 NOTE — Telephone Encounter (Signed)
Pt is requesting a call with the results to her mammogram.   Please advise

## 2023-04-01 DIAGNOSIS — M545 Low back pain, unspecified: Secondary | ICD-10-CM | POA: Diagnosis not present

## 2023-04-01 DIAGNOSIS — M25552 Pain in left hip: Secondary | ICD-10-CM | POA: Diagnosis not present

## 2023-04-05 ENCOUNTER — Ambulatory Visit (INDEPENDENT_AMBULATORY_CARE_PROVIDER_SITE_OTHER): Payer: Commercial Managed Care - PPO | Admitting: Emergency Medicine

## 2023-04-05 ENCOUNTER — Encounter: Payer: Self-pay | Admitting: Emergency Medicine

## 2023-04-05 VITALS — BP 128/72 | HR 75 | Temp 98.4°F | Ht 62.0 in | Wt 160.1 lb

## 2023-04-05 DIAGNOSIS — Z130 Encounter for screening for diseases of the blood and blood-forming organs and certain disorders involving the immune mechanism: Secondary | ICD-10-CM | POA: Diagnosis not present

## 2023-04-05 DIAGNOSIS — Z1329 Encounter for screening for other suspected endocrine disorder: Secondary | ICD-10-CM | POA: Diagnosis not present

## 2023-04-05 DIAGNOSIS — Z87898 Personal history of other specified conditions: Secondary | ICD-10-CM | POA: Diagnosis not present

## 2023-04-05 DIAGNOSIS — Z0000 Encounter for general adult medical examination without abnormal findings: Secondary | ICD-10-CM | POA: Diagnosis not present

## 2023-04-05 DIAGNOSIS — Z13220 Encounter for screening for lipoid disorders: Secondary | ICD-10-CM

## 2023-04-05 DIAGNOSIS — Z13228 Encounter for screening for other metabolic disorders: Secondary | ICD-10-CM | POA: Diagnosis not present

## 2023-04-05 LAB — COMPREHENSIVE METABOLIC PANEL
ALT: 19 U/L (ref 0–35)
AST: 21 U/L (ref 0–37)
Albumin: 4.5 g/dL (ref 3.5–5.2)
Alkaline Phosphatase: 73 U/L (ref 39–117)
BUN: 16 mg/dL (ref 6–23)
CO2: 31 meq/L (ref 19–32)
Calcium: 10 mg/dL (ref 8.4–10.5)
Chloride: 103 meq/L (ref 96–112)
Creatinine, Ser: 0.96 mg/dL (ref 0.40–1.20)
GFR: 67.06 mL/min (ref >60.00)
Glucose, Bld: 162 mg/dL — ABNORMAL HIGH (ref 70–99)
Potassium: 4.1 meq/L (ref 3.5–5.1)
Sodium: 139 meq/L (ref 135–145)
Total Bilirubin: 0.4 mg/dL (ref 0.2–1.2)
Total Protein: 7.8 g/dL (ref 6.0–8.3)

## 2023-04-05 LAB — CBC WITH DIFFERENTIAL/PLATELET
Basophils Absolute: 0 10*3/uL (ref 0.0–0.1)
Basophils Relative: 0.9 % (ref 0.0–3.0)
Eosinophils Absolute: 0.1 10*3/uL (ref 0.0–0.7)
Eosinophils Relative: 1.7 % (ref 0.0–5.0)
HCT: 40 % (ref 36.0–46.0)
Hemoglobin: 13.1 g/dL (ref 12.0–15.0)
Lymphocytes Relative: 38.6 % (ref 12.0–46.0)
Lymphs Abs: 1.8 10*3/uL (ref 0.7–4.0)
MCHC: 32.8 g/dL (ref 30.0–36.0)
MCV: 85.8 fL (ref 78.0–100.0)
Monocytes Absolute: 0.5 10*3/uL (ref 0.1–1.0)
Monocytes Relative: 9.9 % (ref 3.0–12.0)
Neutro Abs: 2.3 10*3/uL (ref 1.4–7.7)
Neutrophils Relative %: 48.9 % (ref 43.0–77.0)
Platelets: 278 10*3/uL (ref 150.0–400.0)
RBC: 4.66 Mil/uL (ref 3.87–5.11)
RDW: 13.8 % (ref 11.5–15.5)
WBC: 4.6 10*3/uL (ref 4.0–10.5)

## 2023-04-05 LAB — LIPID PANEL
Cholesterol: 196 mg/dL (ref 0–200)
HDL: 47.2 mg/dL (ref >39.00)
NonHDL: 148.49
Total CHOL/HDL Ratio: 4
Triglycerides: 244 mg/dL — ABNORMAL HIGH (ref 0.0–149.0)
VLDL: 48.8 mg/dL — ABNORMAL HIGH (ref 0.0–40.0)

## 2023-04-05 LAB — LDL CHOLESTEROL, DIRECT: Direct LDL: 123 mg/dL

## 2023-04-05 LAB — HEMOGLOBIN A1C: Hgb A1c MFr Bld: 6.3 % (ref 4.6–6.5)

## 2023-04-05 MED ORDER — SCOPOLAMINE 1 MG/3DAYS TD PT72: 1.0000 | MEDICATED_PATCH | TRANSDERMAL | 12 refills | Status: AC

## 2023-04-05 NOTE — Progress Notes (Signed)
Brianna Lowery 57 y.o.   Chief Complaint  Patient presents with   Annual Exam    Patient states she was seen an emergeortho for Left hip pain. She was told to do an MRI in 2 weeks.   Patient is travel on cruise and wants patch for motion sickeness     HISTORY OF PRESENT ILLNESS: This is a 57 y.o. female here for annual exam. Recently evaluated for left hip pain by orthopedist.  May get MRI in 2 weeks Will be soon going to a cruise in the [place].  Requesting medication for seasickness. Otherwise healthy female with a healthy lifestyle No other complaints or medical concerns today.  HPI   Prior to Admission medications   Medication Sig Start Date End Date Taking? Authorizing Provider  vitamin B-12 (CYANOCOBALAMIN) 100 MCG tablet Take 100 mcg by mouth daily.   Yes [provider]  vitamin C (ASCORBIC ACID) 250 MG tablet Take 250 mg by mouth daily.   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    Past Medical History:  Diagnosis Date   Diabetes mellitus without complication (HCC)    gestational    Past Surgical History:  Procedure Laterality Date   CESAREAN SECTION     ESSURE TUBAL LIGATION  2014    Social History   Socioeconomic History   Marital status: Married    Spouse name: Adebayo Mitton   Number of children: 3   Years of education: Not on file   Highest education level: Bachelor's degree (e.g., BA, AB, BS)  Occupational History    Employer: [employer]  Tobacco Use   Smoking status: Never   Smokeless tobacco: Never  Vaping Use   Vaping status: Never Used  Substance and Sexual Activity   Alcohol use: No   Drug use: No   Sexual activity: Yes    Partners: Female    Birth control/protection: None  Other Topics Concern   Not on file  Social History Narrative   Not on file   Social Determinants of Health    Financial Resource Strain: Not on file  Food Insecurity: Not on file  Transportation Needs: Not on file  Physical Activity: Unknown (07/18/2019)   Exercise Vital Sign    Days of Exercise per Week: 0 days    Minutes of Exercise per Session: Not on file  Stress: Not on file  Social Connections: Not on file  Intimate Partner Violence: Not on file    Family History  Problem Relation Age of Onset   Colon cancer Neg Hx    Colon polyps Neg Hx    Esophageal cancer Neg Hx    Rectal cancer Neg Hx    Stomach cancer Neg Hx      Review of Systems  Constitutional: Negative.  Negative for chills and fever.  HENT: Negative.  Negative for congestion and sore throat.   Respiratory: Negative.  Negative for cough and shortness of breath.   Cardiovascular: Negative.  Negative for chest pain and palpitations.  Gastrointestinal:  Negative for abdominal pain, nausea and vomiting.  Genitourinary: Negative.  Negative for dysuria and hematuria.  Musculoskeletal:  Positive for joint pain (Left hip pain).  Skin: Negative.  Negative for rash.  Neurological: Negative.  Negative for dizziness and headaches.  All other systems reviewed and are negative.   Today's Vitals  04/05/23 1355  BP: 128/72  Pulse: 75  Temp: 98.4 F (36.9 C)  TempSrc: Oral  SpO2: 100%  Weight: 160 lb 2 oz (72.6 kg)  Height: 5\' 2"  (1.575 m)   Body mass index is 29.29 kg/m.   Physical Exam Vitals reviewed.  Constitutional:      Appearance: Normal appearance.  HENT:     Head: Normocephalic.     Right Ear: Tympanic membrane, ear canal and external ear normal.     Left Ear: Tympanic membrane, ear canal and external ear normal.     Mouth/Throat:     Mouth: Mucous membranes are moist.     Pharynx: Oropharynx is clear.  Eyes:     Extraocular Movements: Extraocular movements intact.     Conjunctiva/sclera: Conjunctivae normal.     Pupils: Pupils are equal, round, and reactive to light.  Cardiovascular:     Rate and  Rhythm: Normal rate and regular rhythm.     Pulses: Normal pulses.     Heart sounds: Normal heart sounds.  Pulmonary:     Effort: Pulmonary effort is normal.     Breath sounds: Normal breath sounds.  Abdominal:     Palpations: Abdomen is soft.     Tenderness: There is no abdominal tenderness.  Musculoskeletal:     Cervical back: No tenderness.     Right lower leg: No edema.     Left lower leg: No edema.  Lymphadenopathy:     Cervical: No cervical adenopathy.  Skin:    General: Skin is warm and dry.     Capillary Refill: Capillary refill takes less than 2 seconds.  Neurological:     General: No focal deficit present.     Mental Status: She is alert and oriented to person, place, and time.  Psychiatric:        Mood and Affect: Mood normal.        Behavior: Behavior normal.      ASSESSMENT & PLAN: Problem List Items Addressed This Visit   None Visit Diagnoses     Routine general medical examination at a health care facility    -  Primary   Relevant Orders   CBC with Differential   Comprehensive metabolic panel   Hemoglobin A1c   Lipid panel   H/O motion sickness       Relevant Medications   scopolamine (TRANSDERM-SCOP) 1 MG/3DAYS   Screening for deficiency anemia       Relevant Orders   CBC with Differential   Screening for lipoid disorders       Relevant Orders   Lipid panel   Screening for endocrine, metabolic and immunity disorder       Relevant Orders   Comprehensive metabolic panel   Hemoglobin A1c      Modifiable risk factors discussed with patient. Anticipatory guidance according to age provided. The following topics were also discussed: Social Determinants of Health Smoking.  Non-smoker Diet and nutrition and need to decrease amount of daily carbohydrate intake and daily calories and increase amount of plant-based protein in her diet Benefits of exercise Cancer screening and review of most recent mammogram and colonoscopy reports Vaccinations  reviewed and recommendations Cardiovascular risk assessment and need for blood work The 10-year ASCVD risk score (Arnett DK, et al., 2019) is: 3.5%   Values used to calculate the score:     Age: 52 years     Sex: Female     Is Non-Hispanic African American: Yes  Diabetic: No     Tobacco smoker: No     Systolic Blood Pressure: 128 mmHg     Is BP treated: No     HDL Cholesterol: 47 mg/dL     Total Cholesterol: 203 mg/dL  Mental health including depression and anxiety Fall and accident prevention  Patient Instructions  Health Maintenance, Female Adopting a healthy lifestyle and getting preventive care are important in promoting health and wellness. Ask your health care provider about: The right schedule for you to have regular tests and exams. Things you can do on your own to prevent diseases and keep yourself healthy. What should I know about diet, weight, and exercise? Eat a healthy diet  Eat a diet that includes plenty of vegetables, fruits, low-fat dairy products, and lean protein. Do not eat a lot of foods that are high in solid fats, added sugars, or sodium. Maintain a healthy weight Body mass index (BMI) is used to identify weight problems. It estimates body fat based on height and weight. Your health care provider can help determine your BMI and help you achieve or maintain a healthy weight. Get regular exercise Get regular exercise. This is one of the most important things you can do for your health. Most adults should: Exercise for at least 150 minutes each week. The exercise should increase your heart rate and make you sweat (moderate-intensity exercise). Do strengthening exercises at least twice a week. This is in addition to the moderate-intensity exercise. Spend less time sitting. Even light physical activity can be beneficial. Watch cholesterol and blood lipids Have your blood tested for lipids and cholesterol at 55 years of age, then have this test every 5  years. Have your cholesterol levels checked more often if: Your lipid or cholesterol levels are high. You are older than 55 years of age. You are at high risk for heart disease. What should I know about cancer screening? Depending on your health history and family history, you may need to have cancer screening at various ages. This may include screening for: Breast cancer. Cervical cancer. Colorectal cancer. Skin cancer. Lung cancer. What should I know about heart disease, diabetes, and high blood pressure? Blood pressure and heart disease High blood pressure causes heart disease and increases the risk of stroke. This is more likely to develop in people who have high blood pressure readings or are overweight. Have your blood pressure checked: Every 3-5 years if you are 67-21 years of age. Every year if you are 109 years old or older. Diabetes Have regular diabetes screenings. This checks your fasting blood sugar level. Have the screening done: Once every three years after age 19 if you are at a normal weight and have a low risk for diabetes. More often and at a younger age if you are overweight or have a high risk for diabetes. What should I know about preventing infection? Hepatitis B If you have a higher risk for hepatitis B, you should be screened for this virus. Talk with your health care provider to find out if you are at risk for hepatitis B infection. Hepatitis C Testing is recommended for: Everyone born from [birth year] through 1963. Anyone with known risk factors for hepatitis C. Sexually transmitted infections (STIs) Get screened for STIs, including gonorrhea and chlamydia, if: You are sexually active and are younger than 55 years of age. You are older than 55 years of age and your health care provider tells you that you are at risk for this type of infection.  Your sexual activity has changed since you were last screened, and you are at increased risk for chlamydia or gonorrhea.  Ask your health care provider if you are at risk. Ask your health care provider about whether you are at high risk for HIV. Your health care provider may recommend a prescription medicine to help prevent HIV infection. If you choose to take medicine to prevent HIV, you should first get tested for HIV. You should then be tested every 3 months for as long as you are taking the medicine. Pregnancy If you are about to stop having your period (premenopausal) and you may become pregnant, seek counseling before you get pregnant. Take 400 to 800 micrograms (mcg) of folic acid every day if you become pregnant. Ask for birth control (contraception) if you want to prevent pregnancy. Osteoporosis and menopause Osteoporosis is a disease in which the bones lose minerals and strength with aging. This can result in bone fractures. If you are 68 years old or older, or if you are at risk for osteoporosis and fractures, ask your health care provider if you should: Be screened for bone loss. Take a calcium or vitamin D supplement to lower your risk of fractures. Be given hormone replacement therapy (HRT) to treat symptoms of menopause. Follow these instructions at home: Alcohol use Do not drink alcohol if: Your health care provider tells you not to drink. You are pregnant, may be pregnant, or are planning to become pregnant. If you drink alcohol: Limit how much you have to: 0-1 drink a day. Know how much alcohol is in your drink. In the [place] one drink equals one 12 oz bottle of beer (355 mL), one 5 oz glass of wine (148 mL), or one 1 oz glass of hard liquor (44 mL). Lifestyle Do not use any products that contain nicotine or tobacco. These products include cigarettes, chewing tobacco, and vaping devices, such as e-cigarettes. If you need help quitting, ask your health care provider. Do not use street drugs. Do not share needles. Ask your health care provider for help if you need support or information about  quitting drugs. General instructions Schedule regular health, dental, and eye exams. Stay current with your vaccines. Tell your health care provider if: You often feel depressed. You have ever been abused or do not feel safe at home. Summary Adopting a healthy lifestyle and getting preventive care are important in promoting health and wellness. Follow your health care provider's instructions about healthy diet, exercising, and getting tested or screened for diseases. Follow your health care provider's instructions on monitoring your cholesterol and blood pressure. This information is not intended to replace advice given to you by your health care provider. Make sure you discuss any questions you have with your health care provider. Document Revised: 01/26/2021 Document Reviewed: 01/26/2021 Elsevier Patient Education  2024 Elsevier Inc.     Edwina Barth, MD [facility]

## 2023-04-05 NOTE — Patient Instructions (Signed)

## 2023-04-20 DIAGNOSIS — M25551 Pain in right hip: Secondary | ICD-10-CM | POA: Diagnosis not present

## 2023-04-20 DIAGNOSIS — M25552 Pain in left hip: Secondary | ICD-10-CM | POA: Diagnosis not present

## 2024-09-04 ENCOUNTER — Ambulatory Visit: Payer: Self-pay

## 2024-09-04 NOTE — Telephone Encounter (Signed)
° ° °  Copied from CRM [id number]. Topic: Clinical - Red Word Triage >> Sep 04, 2024 12:38 PM Alexandria E wrote: [facility] that prompted transfer to Nurse Triage: Dizziness going on since this past Sunday. >> Sep 04, 2024 12:48 PM Alexandria E wrote: Patient was at work and could not hold longer. Please call patient back when available.

## 2024-09-04 NOTE — Telephone Encounter (Signed)
 FYI Only or Action Required?: FYI only for provider: appointment scheduled on 09/06/24.  Patient was last seen in primary care on 04/05/2023 by Purcell Emil Schanz, MD.  Called Nurse Triage reporting Dizziness.  Symptoms began several days ago. Sunday   Interventions attempted: Nothing.  Symptoms are: gradually worsening.  Triage Disposition: See Physician Within 24 Hours  Patient/caregiver understands and will follow disposition?: Yes    See NT encounter below. Recommended if sx worsen go to ED for evaluation or call 911 if fainting occurs.                Reason for Disposition  [1] MODERATE dizziness (e.g., interferes with normal activities) AND [2] has NOT been evaluated by doctor (or NP/PA) for this  (Exception: Dizziness caused by heat exposure, sudden standing, or poor fluid intake.)  Answer Assessment - Initial Assessment Questions Recommended to increase water intake and add gatorade. Patient reports she has been trying to Fast and will try to hydrate. Denies passing out or fainting. Instructed patient to go to ED or call 911 if fainting occurs. Denies chest pain no difficulty breathing no fever no headache no weakness unsure of BP.  Appt scheduled for earliest available 09/06/24.       1. DESCRIPTION: Describe your dizziness.     Lightheaded and room spinning at times. 2. LIGHTHEADED: Do you feel lightheaded? (e.g., somewhat faint, woozy, weak upon standing)     Standing , has felt like passing out at times but not now 3. VERTIGO: Do you feel like either you or the room is spinning or tilting? (i.e., vertigo)     Room spinning  approx few seconds before it stops. 4. SEVERITY: How bad is it?  Do you feel like you are going to faint? Can you stand and walk?     Feels like going to faint. At times,  Can stand but feels dizzy at times. 5. ONSET:  When did the dizziness begin?     Sunday  6. AGGRAVATING FACTORS: Does anything make it worse?  (e.g., standing, change in head position)     Standing too quickly 7. HEART RATE: Can you tell me your heart rate? How many beats in 15 seconds?  (Note: Not all patients can do this.)       na 8. CAUSE: What do you think is causing the dizziness? (e.g., decreased fluids or food, diarrhea, emotional distress, heat exposure, new medicine, sudden standing, vomiting; unknown)     Not sure patient is going to check her BP and call back  9. RECURRENT SYMPTOM: Have you had dizziness before? If Yes, ask: When was the last time? What happened that time?     Last episode today but started Sunday  10. OTHER SYMPTOMS: Do you have any other symptoms? (e.g., fever, chest pain, vomiting, diarrhea, bleeding)       Dizziness , room spinning at times. Feels shaky inside. Has happened 2-3 times and last episode was today  11. PREGNANCY: Is there any chance you are pregnant? When was your last menstrual period?       na  Protocols used: Dizziness - Lightheadedness-A-AH

## 2024-09-06 ENCOUNTER — Encounter: Payer: Self-pay | Admitting: Emergency Medicine

## 2024-09-06 ENCOUNTER — Ambulatory Visit: Admitting: Emergency Medicine

## 2024-09-06 VITALS — BP 122/86 | HR 77 | Temp 98.2°F | Ht 62.0 in | Wt 163.0 lb

## 2024-09-06 DIAGNOSIS — R42 Dizziness and giddiness: Secondary | ICD-10-CM | POA: Insufficient documentation

## 2024-09-06 LAB — LIPID PANEL
Cholesterol: 186 mg/dL (ref 28–200)
HDL: 44.5 mg/dL (ref >39.00)
LDL Cholesterol: 121 mg/dL — ABNORMAL HIGH (ref 10–99)
NonHDL: 141.28
Total CHOL/HDL Ratio: 4
Triglycerides: 101 mg/dL (ref 10.0–149.0)
VLDL: 20.2 mg/dL (ref 0.0–40.0)

## 2024-09-06 LAB — HEMOGLOBIN A1C: Hgb A1c MFr Bld: 6.6 % — ABNORMAL HIGH (ref 4.6–6.5)

## 2024-09-06 LAB — TSH: TSH: 1.53 u[IU]/mL (ref 0.35–5.50)

## 2024-09-06 LAB — CBC WITH DIFFERENTIAL/PLATELET
Basophils Absolute: 0 K/uL (ref 0.0–0.1)
Basophils Relative: 0.5 % (ref 0.0–3.0)
Eosinophils Absolute: 0 K/uL (ref 0.0–0.7)
Eosinophils Relative: 0.7 % (ref 0.0–5.0)
HCT: 40.8 % (ref 36.0–46.0)
Hemoglobin: 13.4 g/dL (ref 12.0–15.0)
Lymphocytes Relative: 41.8 % (ref 12.0–46.0)
Lymphs Abs: 1.9 K/uL (ref 0.7–4.0)
MCHC: 33 g/dL (ref 30.0–36.0)
MCV: 85.6 fl (ref 78.0–100.0)
Monocytes Absolute: 0.4 K/uL (ref 0.1–1.0)
Monocytes Relative: 8.7 % (ref 3.0–12.0)
Neutro Abs: 2.2 K/uL (ref 1.4–7.7)
Neutrophils Relative %: 48.3 % (ref 43.0–77.0)
Platelets: 270 K/uL (ref 150.0–400.0)
RBC: 4.76 Mil/uL (ref 3.87–5.11)
RDW: 13.7 % (ref 11.5–15.5)
WBC: 4.6 K/uL (ref 4.0–10.5)

## 2024-09-06 LAB — URINALYSIS
Bilirubin Urine: NEGATIVE
Hgb urine dipstick: NEGATIVE
Ketones, ur: NEGATIVE
Leukocytes,Ua: NEGATIVE
Nitrite: NEGATIVE
Specific Gravity, Urine: 1.015 (ref 1.000–1.030)
Total Protein, Urine: NEGATIVE
Urine Glucose: NEGATIVE
Urobilinogen, UA: 0.2 (ref 0.0–1.0)
pH: 6 (ref 5.0–8.0)

## 2024-09-06 LAB — VITAMIN D 25 HYDROXY (VIT D DEFICIENCY, FRACTURES): VITD: 11.69 ng/mL — ABNORMAL LOW (ref 30.00–100.00)

## 2024-09-06 LAB — COMPREHENSIVE METABOLIC PANEL WITH GFR
ALT: 20 U/L (ref 3–35)
AST: 20 U/L (ref 5–37)
Albumin: 4.5 g/dL (ref 3.5–5.2)
Alkaline Phosphatase: 67 U/L (ref 39–117)
BUN: 13 mg/dL (ref 6–23)
CO2: 27 meq/L (ref 19–32)
Calcium: 9.5 mg/dL (ref 8.4–10.5)
Chloride: 106 meq/L (ref 96–112)
Creatinine, Ser: 0.89 mg/dL (ref 0.40–1.20)
GFR: 72.71 mL/min (ref >60.00)
Glucose, Bld: 108 mg/dL — ABNORMAL HIGH (ref 70–99)
Potassium: 3.9 meq/L (ref 3.5–5.1)
Sodium: 140 meq/L (ref 135–145)
Total Bilirubin: 0.4 mg/dL (ref 0.2–1.2)
Total Protein: 7.8 g/dL (ref 6.0–8.3)

## 2024-09-06 LAB — VITAMIN B12: Vitamin B-12: 1099 pg/mL — ABNORMAL HIGH (ref 211–911)

## 2024-09-06 NOTE — Patient Instructions (Signed)
 Dizziness Dizziness is a common problem. It makes you feel unsteady or light-headed. You may feel like you're about to faint. Dizziness can lead to getting hurt if you stumble or fall. It's more common to feel dizzy if you're an older adult. Many things can cause you to feel dizzy. These include: Medicines. Dehydration. This is when there's not enough water in your body. Illness. Follow these instructions at home: Eating and drinking  Drink enough fluid to keep your pee (urine) pale yellow. This helps keep you from getting dehydrated. Try to drink more clear fluids, such as water. Do not drink alcohol. Try to limit how much caffeine you take in. Try to limit how much salt, also called sodium, you take in. Activity Try not to make quick movements. Stand up slowly from sitting in a chair. Steady yourself until you feel okay. In the morning, first sit up on the side of the bed. When you feel okay, hold onto something and slowly stand up. Do this until you know that your balance is okay. If you need to stand in one place for a long time, move your legs often. Tighten and relax the muscles in your legs while you're standing. Do not drive or use machines if you feel dizzy. Avoid bending down if you feel dizzy. Place items in your home so you can reach them without leaning over. Lifestyle Do not smoke, vape, or use products with nicotine or tobacco in them. If you need help quitting, talk with your health care provider. Try to lower your stress level. You can do this by using methods like yoga or meditation. Talk with your provider if you need help. General instructions Watch your dizziness for any changes. Take your medicines only as told by your provider. Talk with your provider if you think you're dizzy because of a medicine you're taking. Tell a friend or a family member that you're feeling dizzy. If they spot any changes in your behavior, have them call your provider. Contact a health care  provider if: Your dizziness doesn't go away, or you have new symptoms. Your dizziness gets worse. You feel like you may vomit. You have trouble hearing. You have a fever. You have neck pain or a stiff neck. You fall or get hurt. Get help right away if: You vomit each time you eat or drink. You have watery poop and can't eat or drink. You have trouble talking, walking, swallowing, or using your arms, hands, or legs. You feel very weak. You're bleeding. You're not thinking clearly, or you have trouble forming sentences. A friend or family member may spot this. Your vision changes, or you get a very bad headache. These symptoms may be an emergency. Call 911 right away. Do not wait to see if the symptoms will go away. Do not drive yourself to the hospital. This information is not intended to replace advice given to you by your health care provider. Make sure you discuss any questions you have with your health care provider. Document Revised: 06/09/2023 Document Reviewed: 10/21/2022 Elsevier Patient Education  2024 [employer].

## 2024-09-06 NOTE — Assessment & Plan Note (Signed)
 Clinically stable.  No red flag signs or symptoms Unremarkable physical examination No neurological findings Differential diagnosis discussed Need for diagnostic workup including urine and blood testing today ED precautions given May need neurology evaluation Advised to rest and stay well-hydrated Diet and nutrition discussed

## 2024-09-06 NOTE — Progress Notes (Signed)
 Brianna Lowery 58 y.o.   Chief Complaint  Patient presents with   Dizziness    Pt states that she has been feeling like this for about 2 weeks and she notice that her bp has been high and if her sugar is low which could be causing her to feel like this     HISTORY OF PRESENT ILLNESS: This is a 58 y.o. female complaining of dizziness with occasional vertigo and shakiness for the last 2 weeks Intermittent symptoms of occasional blurry vision No other complaints or medical concerns today.  Dizziness Pertinent negatives include no abdominal pain, chest pain, chills, congestion, coughing, fever, nausea, rash, sore throat or vomiting.     Prior to Admission medications  Medication Sig Start Date End Date Taking? Authorizing Provider  scopolamine (TRANSDERM-SCOP) 1 MG/3DAYS Place 1 patch (1.5 mg total) onto the skin every 3 (three) days. 04/05/23  Yes Phillipa Morden, Emil Schanz, MD  vitamin B-12 (CYANOCOBALAMIN) 100 MCG tablet Take 100 mcg by mouth daily.   Yes [provider]  vitamin C (ASCORBIC ACID) 250 MG tablet Take 250 mg by mouth daily.   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/22/2017    Past Medical History:  Diagnosis Date   Diabetes mellitus without complication (HCC)    gestational    Past Surgical History:  Procedure Laterality Date   CESAREAN SECTION     ESSURE TUBAL LIGATION  2014    Social History   Socioeconomic History   Marital status: Married    Spouse name: Adebayo Burright   Number of children: 3   Years of education: Not on file   Highest education level: Bachelor's degree (e.g., BA, AB, BS)  Occupational History    Employer: [employer]  Tobacco Use   Smoking status: Never   Smokeless tobacco: Never  Vaping Use   Vaping status: Never Used  Substance and Sexual Activity   Alcohol use: No   Drug use: No   Sexual  activity: Yes    Partners: Female    Birth control/protection: None  Other Topics Concern   Not on file  Social History Narrative   ** Merged History Encounter **       Social Drivers of Health   Tobacco Use: Low Risk (09/06/2024)   Patient History    Smoking Tobacco Use: Never    Smokeless Tobacco Use: Never    Passive Exposure: Not on file  Financial Resource Strain: Not on file  Food Insecurity: Not on file  Transportation Needs: Not on file  Physical Activity: Not on file  Stress: Not on file  Social Connections: Not on file  Intimate Partner Violence: Not on file  Depression (PHQ2-9): Low Risk (04/05/2023)   Depression (PHQ2-9)    PHQ-2 Score: 0  Alcohol Screen: Not on file  Housing: Not on file  Utilities: Not on file  Health Literacy: Not on file    Family History  Problem Relation Age of Onset   Colon cancer Neg Hx    Colon polyps Neg Hx    Esophageal cancer Neg Hx    Rectal cancer Neg Hx    Stomach cancer Neg Hx      Review of Systems  Constitutional: Negative.  Negative for chills and fever.  HENT: Negative.  Negative for congestion and sore throat.   Eyes:  Positive for blurred vision.  Respiratory: Negative.  Negative for cough and shortness of breath.   Cardiovascular: Negative.  Negative for chest pain and palpitations.  Gastrointestinal:  Negative for abdominal pain, diarrhea, nausea and vomiting.  Genitourinary: Negative.  Negative for dysuria and hematuria.  Skin: Negative.  Negative for rash.  Neurological:  Positive for dizziness.  All other systems reviewed and are negative.   Vitals:   09/06/24 1601  BP: 122/86  Pulse: 77  Temp: 98.2 F (36.8 C)  SpO2: 97%    Physical Exam Vitals reviewed.  Constitutional:      Appearance: Normal appearance.  HENT:     Head: Normocephalic.     Right Ear: Tympanic membrane, ear canal and external ear normal.     Left Ear: Tympanic membrane, ear canal and external ear normal.     Nose: No  congestion.     Mouth/Throat:     Mouth: Mucous membranes are moist.     Pharynx: Oropharynx is clear.  Eyes:     Extraocular Movements: Extraocular movements intact.     Conjunctiva/sclera: Conjunctivae normal.     Pupils: Pupils are equal, round, and reactive to light.  Cardiovascular:     Rate and Rhythm: Normal rate and regular rhythm.     Pulses: Normal pulses.     Heart sounds: Normal heart sounds.  Pulmonary:     Effort: Pulmonary effort is normal.     Breath sounds: Normal breath sounds.  Abdominal:     Palpations: Abdomen is soft.     Tenderness: There is no abdominal tenderness.  Musculoskeletal:     Cervical back: No tenderness.     Right lower leg: No edema.     Left lower leg: No edema.  Lymphadenopathy:     Cervical: No cervical adenopathy.  Skin:    General: Skin is warm and dry.  Neurological:     General: No focal deficit present.     Mental Status: She is alert and oriented to person, place, and time.     Cranial Nerves: No cranial nerve deficit.     Sensory: No sensory deficit.     Motor: No weakness.     Coordination: Coordination normal.     Gait: Gait normal.  Psychiatric:        Mood and Affect: Mood normal.        Behavior: Behavior normal.      ASSESSMENT & PLAN: A total of 40 minutes was spent with the patient and counseling/coordination of care regarding preparing for this visit, review of most recent office visit notes, differential diagnosis of dizziness and need for workup, review of all medications, review of most recent bloodwork results, review of health maintenance items, education on nutrition, prognosis, documentation, and need for follow up.   Problem List Items Addressed This Visit       Other   Dizziness - Primary   Clinically stable.  No red flag signs or symptoms Unremarkable physical examination No neurological findings Differential diagnosis discussed Need for diagnostic workup including urine and blood testing today ED  precautions given May need neurology evaluation Advised to rest and stay well-hydrated Diet and nutrition discussed      Relevant Orders   Comprehensive metabolic panel with GFR   CBC with Differential/Platelet   Hemoglobin A1c   Lipid panel   Vitamin B12   TSH   VITAMIN D  25 Hydroxy (Vit-D Deficiency, Fractures)   Urinalysis   Patient Instructions  Dizziness Dizziness is a common problem. It  makes you feel unsteady or light-headed. You may feel like you're about to faint. Dizziness can lead to getting hurt if you stumble or fall. It's more common to feel dizzy if you're an older adult. Many things can cause you to feel dizzy. These include: Medicines. Dehydration. This is when there's not enough water in your body. Illness. Follow these instructions at home: Eating and drinking  Drink enough fluid to keep your pee (urine) pale yellow. This helps keep you from getting dehydrated. Try to drink more clear fluids, such as water. Do not drink alcohol. Try to limit how much caffeine you take in. Try to limit how much salt, also called sodium, you take in. Activity Try not to make quick movements. Stand up slowly from sitting in a chair. Steady yourself until you feel okay. In the morning, first sit up on the side of the bed. When you feel okay, hold onto something and slowly stand up. Do this until you know that your balance is okay. If you need to stand in one place for a long time, move your legs often. Tighten and relax the muscles in your legs while you're standing. Do not drive or use machines if you feel dizzy. Avoid bending down if you feel dizzy. Place items in your home so you can reach them without leaning over. Lifestyle Do not smoke, vape, or use products with nicotine or tobacco in them. If you need help quitting, talk with your health care provider. Try to lower your stress level. You can do this by using methods like yoga or meditation. Talk with your provider if  you need help. General instructions Watch your dizziness for any changes. Take your medicines only as told by your provider. Talk with your provider if you think you're dizzy because of a medicine you're taking. Tell a friend or a family member that you're feeling dizzy. If they spot any changes in your behavior, have them call your provider. Contact a health care provider if: Your dizziness doesn't go away, or you have new symptoms. Your dizziness gets worse. You feel like you may vomit. You have trouble hearing. You have a fever. You have neck pain or a stiff neck. You fall or get hurt. Get help right away if: You vomit each time you eat or drink. You have watery poop and can't eat or drink. You have trouble talking, walking, swallowing, or using your arms, hands, or legs. You feel very weak. You're bleeding. You're not thinking clearly, or you have trouble forming sentences. A friend or family member may spot this. Your vision changes, or you get a very bad headache. These symptoms may be an emergency. Call 911 right away. Do not wait to see if the symptoms will go away. Do not drive yourself to the hospital. This information is not intended to replace advice given to you by your health care provider. Make sure you discuss any questions you have with your health care provider. Document Revised: 06/09/2023 Document Reviewed: 10/21/2022 Elsevier Patient Education  2024 Elsevier Inc.     Emil Schaumann, MD [facility]    [1] No Known Allergies

## 2024-09-07 ENCOUNTER — Ambulatory Visit: Payer: Self-pay | Admitting: Emergency Medicine

## 2024-09-07 DIAGNOSIS — E119 Type 2 diabetes mellitus without complications: Secondary | ICD-10-CM | POA: Insufficient documentation

## 2024-09-07 DIAGNOSIS — E559 Vitamin D deficiency, unspecified: Secondary | ICD-10-CM | POA: Insufficient documentation

## 2024-09-07 MED ORDER — METFORMIN HCL 500 MG PO TABS: 500.0000 mg | ORAL_TABLET | Freq: Two times a day (BID) | ORAL | 3 refills | Status: AC

## 2024-09-07 MED ORDER — VITAMIN D (ERGOCALCIFEROL) 1.25 MG (50000 UNIT) PO CAPS: 50000.0000 [IU] | ORAL_CAPSULE | ORAL | 1 refills | Status: AC

## 2024-09-18 ENCOUNTER — Encounter: Payer: Self-pay | Admitting: Emergency Medicine

## 2024-09-18 ENCOUNTER — Ambulatory Visit (INDEPENDENT_AMBULATORY_CARE_PROVIDER_SITE_OTHER): Admitting: Emergency Medicine

## 2024-09-18 VITALS — BP 114/62 | HR 44 | Temp 98.4°F | Ht 62.0 in | Wt 163.0 lb

## 2024-09-18 DIAGNOSIS — E559 Vitamin D deficiency, unspecified: Secondary | ICD-10-CM | POA: Diagnosis not present

## 2024-09-18 DIAGNOSIS — Z0001 Encounter for general adult medical examination with abnormal findings: Secondary | ICD-10-CM

## 2024-09-18 DIAGNOSIS — Z0000 Encounter for general adult medical examination without abnormal findings: Secondary | ICD-10-CM

## 2024-09-18 DIAGNOSIS — E119 Type 2 diabetes mellitus without complications: Secondary | ICD-10-CM

## 2024-09-18 DIAGNOSIS — Z1231 Encounter for screening mammogram for malignant neoplasm of breast: Secondary | ICD-10-CM

## 2024-09-18 DIAGNOSIS — R001 Bradycardia, unspecified: Secondary | ICD-10-CM | POA: Insufficient documentation

## 2024-09-18 NOTE — Patient Instructions (Signed)

## 2024-09-18 NOTE — Progress Notes (Signed)
 Brianna Lowery Lowers 58 y.o.   Chief Complaint  Patient presents with   Annual Exam    HISTORY OF PRESENT ILLNESS: This is a 58 y.o. female here for annual exam Recently diagnosed with diabetes.  Does not want to start medication.  Wants to control it with diet and nutrition. No other complaints or medical concerns today.  HPI   Prior to Admission medications  Medication Sig Start Date End Date Taking? Authorizing Provider  metFORMIN  (GLUCOPHAGE ) 500 MG tablet Take 1 tablet (500 mg total) by mouth 2 (two) times daily with a meal. 09/07/24   Jimel Myler, Emil Schanz, MD  scopolamine  (TRANSDERM-SCOP) 1 MG/3DAYS Place 1 patch (1.5 mg total) onto the skin every 3 (three) days. 04/05/23   Vern Guerette Jose, MD  vitamin B-12 (CYANOCOBALAMIN ) 100 MCG tablet Take 100 mcg by mouth daily.    [provider]  vitamin C (ASCORBIC ACID) 250 MG tablet Take 250 mg by mouth daily.    [provider]  Vitamin D , Ergocalciferol , (DRISDOL ) 1.25 MG (50000 UNIT) CAPS capsule Take 1 capsule (50,000 Units total) by mouth every 7 (seven) days. 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    Past Medical History:  Diagnosis Date   Diabetes mellitus without complication (HCC)    gestational    Past Surgical History:  Procedure Laterality Date   CESAREAN SECTION     ESSURE TUBAL LIGATION  2014    Social History   Socioeconomic History   Marital status: Married    Spouse name: Adebayo Jacob   Number of children: 3   Years of education: Not on file   Highest education level: Bachelor's degree (e.g., BA, AB, BS)  Occupational History    Employer: [employer]  Tobacco Use   Smoking status: Never   Smokeless tobacco: Never  Vaping Use   Vaping status: Never Used   Substance and Sexual Activity   Alcohol use: No   Drug use: No   Sexual activity: Yes    Partners: Female    Birth control/protection: None  Other Topics Concern   Not on file  Social History Narrative   ** Merged History Encounter **       Social Drivers of Health   Tobacco Use: Low Risk (09/18/2024)   Patient History    Smoking Tobacco Use: Never    Smokeless Tobacco Use: Never    Passive Exposure: Not on file  Financial Resource Strain: Not on file  Food Insecurity: Not on file  Transportation Needs: Not on file  Physical Activity: Not on file  Stress: Not on file  Social Connections: Not on file  Intimate Partner Violence: Not on file  Depression (PHQ2-9): Low Risk (09/18/2024)   Depression (PHQ2-9)    PHQ-2 Score: 0  Alcohol Screen: Not on file  Housing: Not on file  Utilities: Not on file  Health Literacy: Not on file    Family History  Problem Relation Age of Onset   Colon cancer Neg Hx    Colon polyps Neg Hx    Esophageal cancer Neg Hx    Rectal cancer Neg Hx    Stomach cancer Neg Hx      Review of Systems  Constitutional: Negative.  Negative for chills and fever.  HENT: Negative.  Negative for congestion and sore throat.   Respiratory: Negative.  Negative for cough and shortness of breath.   Cardiovascular: Negative.  Negative for chest pain and palpitations.  Gastrointestinal:  Negative for abdominal pain, diarrhea, nausea and vomiting.  Genitourinary: Negative.  Negative for dysuria and hematuria.  Skin: Negative.  Negative for rash.  Neurological: Negative.  Negative for dizziness and headaches.  All other systems reviewed and are negative.   Vitals:   09/18/24 1303  BP: 114/62  Pulse: (!) 44  Temp: 98.4 F (36.9 C)  SpO2: 96%    Physical Exam Vitals reviewed.  Constitutional:      Appearance: Normal appearance.  HENT:     Head: Normocephalic.     Right Ear: Tympanic membrane, ear canal and external ear normal.     Left Ear:  Tympanic membrane, ear canal and external ear normal.     Mouth/Throat:     Mouth: Mucous membranes are moist.     Pharynx: Oropharynx is clear.  Eyes:     Extraocular Movements: Extraocular movements intact.     Conjunctiva/sclera: Conjunctivae normal.     Pupils: Pupils are equal, round, and reactive to light.  Cardiovascular:     Rate and Rhythm: Normal rate and regular rhythm.     Pulses: Normal pulses.     Heart sounds: Normal heart sounds.  Pulmonary:     Effort: Pulmonary effort is normal.     Breath sounds: Normal breath sounds.  Abdominal:     Palpations: Abdomen is soft.     Tenderness: There is no abdominal tenderness.  Musculoskeletal:     Cervical back: No tenderness.  Lymphadenopathy:     Cervical: No cervical adenopathy.  Skin:    General: Skin is warm and dry.     Capillary Refill: Capillary refill takes less than 2 seconds.  Neurological:     General: No focal deficit present.     Mental Status: She is alert and oriented to person, place, and time.  Psychiatric:        Mood and Affect: Mood normal.        Behavior: Behavior normal.   EKG: Normal sinus rhythm with ventricular rate of 66.  Nonspecific T wave abnormality.   ASSESSMENT & PLAN: Problem List Items Addressed This Visit       Endocrine   New onset type 2 diabetes mellitus (HCC)   Lab Results  Component Value Date   HGBA1C 6.6 (H) 09/06/2024  Did not start metformin .  Does not want to start medication. Wants to try dietary changes first along with exercise. Cardiovascular risks associated with diabetes discussed Diet and nutrition discussed Benefits of exercise discussed Recommend follow-up in 3 months         Other   Vitamin D  deficiency   Started vitamin D  supplementation recently States her symptoms are better      Bradycardia   Regular bradycardia on physical examination Asymptomatic EKG shows normal sinus rhythm with ventricular rate of 66 Nonspecific T wave abnormality       Other Visit Diagnoses       Encounter for general adult medical examination with abnormal findings    -  Primary     Screening mammogram for breast cancer       Relevant Orders   MM 3D SCREENING MAMMOGRAM BILATERAL BREAST        Modifiable risk factors discussed with patient. Anticipatory guidance according to age provided. The following topics were  also discussed: Social Determinants of Health Smoking.  Non-smoker Diet and nutrition Benefits of exercise Cancer screening and need for breast cancer screening with mammogram Vaccinations Cardiovascular risk assessment The 10-year ASCVD risk score (Arnett DK, et al., 2019) is: 6.5%   Values used to calculate the score:     Age: 11 years     Clinically relevant sex: Female     Is Non-Hispanic African American: Yes     Diabetic: Yes     Tobacco smoker: No     Systolic Blood Pressure: 114 mmHg     Is BP treated: No     HDL Cholesterol: 44.5 mg/dL     Total Cholesterol: 186 mg/dL  Mental health including depression and anxiety Fall and accident prevention  Patient Instructions  Health Maintenance, Female Adopting a healthy lifestyle and getting preventive care are important in promoting health and wellness. Ask your health care provider about: The right schedule for you to have regular tests and exams. Things you can do on your own to prevent diseases and keep yourself healthy. What should I know about diet, weight, and exercise? Eat a healthy diet  Eat a diet that includes plenty of vegetables, fruits, low-fat dairy products, and lean protein. Do not eat a lot of foods that are high in solid fats, added sugars, or sodium. Maintain a healthy weight Body mass index (BMI) is used to identify weight problems. It estimates body fat based on height and weight. Your health care provider can help determine your BMI and help you achieve or maintain a healthy weight. Get regular exercise Get regular exercise. This is one of the  most important things you can do for your health. Most adults should: Exercise for at least 150 minutes each week. The exercise should increase your heart rate and make you sweat (moderate-intensity exercise). Do strengthening exercises at least twice a week. This is in addition to the moderate-intensity exercise. Spend less time sitting. Even light physical activity can be beneficial. Watch cholesterol and blood lipids Have your blood tested for lipids and cholesterol at 56 years of age, then have this test every 5 years. Have your cholesterol levels checked more often if: Your lipid or cholesterol levels are high. You are older than 56 years of age. You are at high risk for heart disease. What should I know about cancer screening? Depending on your health history and family history, you may need to have cancer screening at various ages. This may include screening for: Breast cancer. Cervical cancer. Colorectal cancer. Skin cancer. Lung cancer. What should I know about heart disease, diabetes, and high blood pressure? Blood pressure and heart disease High blood pressure causes heart disease and increases the risk of stroke. This is more likely to develop in people who have high blood pressure readings or are overweight. Have your blood pressure checked: Every 3-5 years if you are 79-2 years of age. Every year if you are 59 years old or older. Diabetes Have regular diabetes screenings. This checks your fasting blood sugar level. Have the screening done: Once every three years after age 53 if you are at a normal weight and have a low risk for diabetes. More often and at a younger age if you are overweight or have a high risk for diabetes. What should I know about preventing infection? Hepatitis B If you have a higher risk for hepatitis B, you should be screened for this virus. Talk with your health care provider to find out if you  are at risk for hepatitis B infection. Hepatitis  C Testing is recommended for: Everyone born from [birth year] through 1963. Anyone with known risk factors for hepatitis C. Sexually transmitted infections (STIs) Get screened for STIs, including gonorrhea and chlamydia, if: You are sexually active and are younger than 56 years of age. You are older than 56 years of age and your health care provider tells you that you are at risk for this type of infection. Your sexual activity has changed since you were last screened, and you are at increased risk for chlamydia or gonorrhea. Ask your health care provider if you are at risk. Ask your health care provider about whether you are at high risk for HIV. Your health care provider may recommend a prescription medicine to help prevent HIV infection. If you choose to take medicine to prevent HIV, you should first get tested for HIV. You should then be tested every 3 months for as long as you are taking the medicine. Pregnancy If you are about to stop having your period (premenopausal) and you may become pregnant, seek counseling before you get pregnant. Take 400 to 800 micrograms (mcg) of folic acid every day if you become pregnant. Ask for birth control (contraception) if you want to prevent pregnancy. Osteoporosis and menopause Osteoporosis is a disease in which the bones lose minerals and strength with aging. This can result in bone fractures. If you are 3 years old or older, or if you are at risk for osteoporosis and fractures, ask your health care provider if you should: Be screened for bone loss. Take a calcium or vitamin D  supplement to lower your risk of fractures. Be given hormone replacement therapy (HRT) to treat symptoms of menopause. Follow these instructions at home: Alcohol use Do not drink alcohol if: Your health care provider tells you not to drink. You are pregnant, may be pregnant, or are planning to become pregnant. If you drink alcohol: Limit how much you have to: 0-1 drink a day. Know  how much alcohol is in your drink. In the [place] one drink equals one 12 oz bottle of beer (355 mL), one 5 oz glass of wine (148 mL), or one 1 oz glass of hard liquor (44 mL). Lifestyle Do not use any products that contain nicotine or tobacco. These products include cigarettes, chewing tobacco, and vaping devices, such as e-cigarettes. If you need help quitting, ask your health care provider. Do not use street drugs. Do not share needles. Ask your health care provider for help if you need support or information about quitting drugs. General instructions Schedule regular health, dental, and eye exams. Stay current with your vaccines. Tell your health care provider if: You often feel depressed. You have ever been abused or do not feel safe at home. Summary Adopting a healthy lifestyle and getting preventive care are important in promoting health and wellness. Follow your health care provider's instructions about healthy diet, exercising, and getting tested or screened for diseases. Follow your health care provider's instructions on monitoring your cholesterol and blood pressure. This information is not intended to replace advice given to you by your health care provider. Make sure you discuss any questions you have with your health care provider. Document Revised: 01/26/2021 Document Reviewed: 01/26/2021 Elsevier Patient Education  2024 Elsevier Inc.     Emil Schaumann, MD [facility]    [1] No Known Allergies

## 2024-09-18 NOTE — Addendum Note (Signed)
 Addended by: ROSALVA LEX RAMAN on: 09/18/2024 02:31 PM   Modules accepted: Orders

## 2024-09-18 NOTE — Assessment & Plan Note (Signed)
 Regular bradycardia on physical examination Asymptomatic EKG shows normal sinus rhythm with ventricular rate of 66 Nonspecific T wave abnormality

## 2024-09-18 NOTE — Assessment & Plan Note (Signed)
 Started vitamin D  supplementation recently States her symptoms are better

## 2024-09-18 NOTE — Assessment & Plan Note (Signed)
 Lab Results  Component Value Date   HGBA1C 6.6 (H) 09/06/2024  Did not start metformin .  Does not want to start medication. Wants to try dietary changes first along with exercise. Cardiovascular risks associated with diabetes discussed Diet and nutrition discussed Benefits of exercise discussed Recommend follow-up in 3 months

## 2024-10-03 ENCOUNTER — Inpatient Hospital Stay: Admission: RE | Admit: 2024-10-03 | Discharge: 2024-10-03 | Attending: Emergency Medicine

## 2024-10-03 DIAGNOSIS — Z1231 Encounter for screening mammogram for malignant neoplasm of breast: Secondary | ICD-10-CM

## 2024-10-05 ENCOUNTER — Ambulatory Visit: Payer: Self-pay | Admitting: Emergency Medicine

## 2024-12-19 ENCOUNTER — Ambulatory Visit: Admitting: Emergency Medicine
